# Patient Record
Sex: Female | Born: 1970 | Race: Black or African American | Hispanic: No | Smoking: Never smoker
Health system: Southern US, Community
[De-identification: ages and names within clinical notes are randomized; demographics above are authoritative.]

## PROBLEM LIST (undated history)

## (undated) DIAGNOSIS — I10 Essential (primary) hypertension: Secondary | ICD-10-CM

## (undated) DIAGNOSIS — F329 Major depressive disorder, single episode, unspecified: Secondary | ICD-10-CM

## (undated) DIAGNOSIS — K219 Gastro-esophageal reflux disease without esophagitis: Secondary | ICD-10-CM

## (undated) DIAGNOSIS — G4733 Obstructive sleep apnea (adult) (pediatric): Secondary | ICD-10-CM

## (undated) DIAGNOSIS — K859 Acute pancreatitis without necrosis or infection, unspecified: Secondary | ICD-10-CM

## (undated) DIAGNOSIS — K760 Fatty (change of) liver, not elsewhere classified: Secondary | ICD-10-CM

## (undated) DIAGNOSIS — F4310 Post-traumatic stress disorder, unspecified: Secondary | ICD-10-CM

## (undated) DIAGNOSIS — T8859XA Other complications of anesthesia, initial encounter: Secondary | ICD-10-CM

## (undated) DIAGNOSIS — K5792 Diverticulitis of intestine, part unspecified, without perforation or abscess without bleeding: Secondary | ICD-10-CM

## (undated) DIAGNOSIS — A6000 Herpesviral infection of urogenital system, unspecified: Secondary | ICD-10-CM

## (undated) HISTORY — PX: UMBILICAL HERNIA REPAIR: SHX196

## (undated) HISTORY — PX: TUBAL LIGATION: SHX77

## (undated) HISTORY — PX: LAPAROSCOPIC GASTRIC BAND REMOVAL WITH LAPAROSCOPIC GASTRIC SLEEVE RESECTION: SHX6498

## (undated) HISTORY — PX: KNEE SURGERY: SHX244

## (undated) HISTORY — PX: CHOLECYSTECTOMY: SHX55

## (undated) HISTORY — PX: PANNICULECTOMY: SUR1001

## (undated) HISTORY — PX: ABLATION: SHX5711

## (undated) HISTORY — PX: REFRACTIVE SURGERY: SHX103

---

## 1998-12-16 ENCOUNTER — Emergency Department (HOSPITAL_COMMUNITY): Admission: EM | Admit: 1998-12-16 | Discharge: 1998-12-16 | Payer: Self-pay | Admitting: Emergency Medicine

## 1998-12-17 ENCOUNTER — Encounter: Payer: Self-pay | Admitting: Emergency Medicine

## 1999-11-18 ENCOUNTER — Other Ambulatory Visit: Admission: RE | Admit: 1999-11-18 | Discharge: 1999-11-18 | Payer: Self-pay | Admitting: Obstetrics and Gynecology

## 2000-01-06 ENCOUNTER — Encounter: Payer: Self-pay | Admitting: Obstetrics and Gynecology

## 2000-01-06 ENCOUNTER — Ambulatory Visit (HOSPITAL_COMMUNITY): Admission: RE | Admit: 2000-01-06 | Discharge: 2000-01-06 | Payer: Self-pay | Admitting: Obstetrics and Gynecology

## 2000-02-26 ENCOUNTER — Encounter: Payer: Self-pay | Admitting: Obstetrics & Gynecology

## 2000-02-26 ENCOUNTER — Inpatient Hospital Stay (HOSPITAL_COMMUNITY): Admission: AD | Admit: 2000-02-26 | Discharge: 2000-02-26 | Payer: Self-pay | Admitting: *Deleted

## 2000-03-14 ENCOUNTER — Observation Stay (HOSPITAL_COMMUNITY): Admission: AD | Admit: 2000-03-14 | Discharge: 2000-03-15 | Payer: Self-pay | Admitting: Obstetrics and Gynecology

## 2000-03-14 ENCOUNTER — Encounter: Payer: Self-pay | Admitting: Obstetrics and Gynecology

## 2000-03-30 ENCOUNTER — Inpatient Hospital Stay (HOSPITAL_COMMUNITY): Admission: AD | Admit: 2000-03-30 | Discharge: 2000-03-30 | Payer: Self-pay | Admitting: Obstetrics & Gynecology

## 2000-04-05 ENCOUNTER — Inpatient Hospital Stay (HOSPITAL_COMMUNITY): Admission: AD | Admit: 2000-04-05 | Discharge: 2000-04-05 | Payer: Self-pay | Admitting: *Deleted

## 2000-04-11 ENCOUNTER — Inpatient Hospital Stay (HOSPITAL_COMMUNITY): Admission: AD | Admit: 2000-04-11 | Discharge: 2000-04-11 | Payer: Self-pay | Admitting: Obstetrics & Gynecology

## 2000-05-10 ENCOUNTER — Ambulatory Visit (HOSPITAL_COMMUNITY): Admission: RE | Admit: 2000-05-10 | Discharge: 2000-05-10 | Payer: Self-pay | Admitting: Obstetrics & Gynecology

## 2000-05-10 ENCOUNTER — Encounter: Payer: Self-pay | Admitting: Obstetrics and Gynecology

## 2000-06-05 ENCOUNTER — Inpatient Hospital Stay (HOSPITAL_COMMUNITY): Admission: AD | Admit: 2000-06-05 | Discharge: 2000-06-05 | Payer: Self-pay | Admitting: Obstetrics and Gynecology

## 2000-06-18 ENCOUNTER — Inpatient Hospital Stay (HOSPITAL_COMMUNITY): Admission: AD | Admit: 2000-06-18 | Discharge: 2000-06-22 | Payer: Self-pay | Admitting: Obstetrics and Gynecology

## 2000-11-28 ENCOUNTER — Inpatient Hospital Stay (HOSPITAL_COMMUNITY): Admission: AD | Admit: 2000-11-28 | Discharge: 2000-11-30 | Payer: Self-pay | Admitting: Psychiatry

## 2000-12-01 ENCOUNTER — Other Ambulatory Visit (HOSPITAL_COMMUNITY): Admission: RE | Admit: 2000-12-01 | Discharge: 2000-12-08 | Payer: Self-pay | Admitting: Psychiatry

## 2002-04-26 ENCOUNTER — Emergency Department (HOSPITAL_COMMUNITY): Admission: EM | Admit: 2002-04-26 | Discharge: 2002-04-26 | Payer: Self-pay | Admitting: Emergency Medicine

## 2003-06-24 ENCOUNTER — Emergency Department (HOSPITAL_COMMUNITY): Admission: EM | Admit: 2003-06-24 | Discharge: 2003-06-24 | Payer: Self-pay | Admitting: Emergency Medicine

## 2004-02-25 ENCOUNTER — Emergency Department (HOSPITAL_COMMUNITY): Admission: EM | Admit: 2004-02-25 | Discharge: 2004-02-25 | Payer: Self-pay | Admitting: Emergency Medicine

## 2004-05-31 ENCOUNTER — Emergency Department (HOSPITAL_COMMUNITY): Admission: EM | Admit: 2004-05-31 | Discharge: 2004-05-31 | Payer: Self-pay | Admitting: *Deleted

## 2004-06-08 ENCOUNTER — Ambulatory Visit: Payer: Self-pay | Admitting: Family Medicine

## 2004-06-12 ENCOUNTER — Ambulatory Visit: Payer: Self-pay | Admitting: Family Medicine

## 2004-07-02 ENCOUNTER — Ambulatory Visit: Payer: Self-pay | Admitting: Family Medicine

## 2004-07-06 ENCOUNTER — Ambulatory Visit: Payer: Self-pay | Admitting: *Deleted

## 2004-07-06 ENCOUNTER — Ambulatory Visit (HOSPITAL_COMMUNITY): Admission: RE | Admit: 2004-07-06 | Discharge: 2004-07-06 | Payer: Self-pay | Admitting: Family Medicine

## 2004-07-06 ENCOUNTER — Ambulatory Visit: Payer: Self-pay | Admitting: Family Medicine

## 2004-08-06 ENCOUNTER — Ambulatory Visit: Payer: Self-pay | Admitting: Family Medicine

## 2004-08-19 ENCOUNTER — Encounter (INDEPENDENT_AMBULATORY_CARE_PROVIDER_SITE_OTHER): Payer: Self-pay | Admitting: Family Medicine

## 2004-08-19 LAB — CONVERTED CEMR LAB

## 2004-09-10 ENCOUNTER — Ambulatory Visit: Payer: Self-pay | Admitting: Family Medicine

## 2004-09-17 ENCOUNTER — Ambulatory Visit: Payer: Self-pay | Admitting: Family Medicine

## 2004-09-23 ENCOUNTER — Ambulatory Visit (HOSPITAL_COMMUNITY): Admission: RE | Admit: 2004-09-23 | Discharge: 2004-09-23 | Payer: Self-pay | Admitting: Family Medicine

## 2004-10-15 ENCOUNTER — Ambulatory Visit: Payer: Self-pay | Admitting: Family Medicine

## 2004-10-19 ENCOUNTER — Ambulatory Visit: Payer: Self-pay | Admitting: Family Medicine

## 2005-01-21 ENCOUNTER — Emergency Department (HOSPITAL_COMMUNITY): Admission: EM | Admit: 2005-01-21 | Discharge: 2005-01-22 | Payer: Self-pay | Admitting: Emergency Medicine

## 2005-01-22 ENCOUNTER — Inpatient Hospital Stay (HOSPITAL_COMMUNITY): Admission: AD | Admit: 2005-01-22 | Discharge: 2005-01-28 | Payer: Self-pay | Admitting: Gastroenterology

## 2005-01-22 ENCOUNTER — Ambulatory Visit: Payer: Self-pay | Admitting: Gastroenterology

## 2005-01-26 ENCOUNTER — Encounter (INDEPENDENT_AMBULATORY_CARE_PROVIDER_SITE_OTHER): Payer: Self-pay | Admitting: *Deleted

## 2005-05-10 ENCOUNTER — Ambulatory Visit: Payer: Self-pay | Admitting: Family Medicine

## 2005-07-12 ENCOUNTER — Ambulatory Visit: Payer: Self-pay | Admitting: Family Medicine

## 2005-09-07 ENCOUNTER — Ambulatory Visit: Payer: Self-pay | Admitting: Family Medicine

## 2005-09-08 ENCOUNTER — Ambulatory Visit: Payer: Self-pay | Admitting: Family Medicine

## 2005-10-07 ENCOUNTER — Ambulatory Visit: Payer: Self-pay | Admitting: Family Medicine

## 2005-12-15 ENCOUNTER — Ambulatory Visit: Payer: Self-pay | Admitting: Family Medicine

## 2006-03-08 ENCOUNTER — Encounter: Admission: RE | Admit: 2006-03-08 | Discharge: 2006-03-08 | Payer: Self-pay | Admitting: Obstetrics and Gynecology

## 2006-04-05 ENCOUNTER — Ambulatory Visit (HOSPITAL_COMMUNITY): Admission: RE | Admit: 2006-04-05 | Discharge: 2006-04-05 | Payer: Self-pay | Admitting: Obstetrics and Gynecology

## 2006-04-19 ENCOUNTER — Ambulatory Visit (HOSPITAL_COMMUNITY): Admission: RE | Admit: 2006-04-19 | Discharge: 2006-04-19 | Payer: Self-pay | Admitting: Obstetrics and Gynecology

## 2006-04-21 ENCOUNTER — Inpatient Hospital Stay (HOSPITAL_COMMUNITY): Admission: AD | Admit: 2006-04-21 | Discharge: 2006-04-21 | Payer: Self-pay | Admitting: Obstetrics and Gynecology

## 2006-05-03 ENCOUNTER — Ambulatory Visit (HOSPITAL_COMMUNITY): Admission: RE | Admit: 2006-05-03 | Discharge: 2006-05-03 | Payer: Self-pay | Admitting: Obstetrics and Gynecology

## 2006-05-27 ENCOUNTER — Ambulatory Visit (HOSPITAL_COMMUNITY): Admission: RE | Admit: 2006-05-27 | Discharge: 2006-05-27 | Payer: Self-pay | Admitting: Obstetrics and Gynecology

## 2006-05-31 ENCOUNTER — Encounter: Admission: RE | Admit: 2006-05-31 | Discharge: 2006-06-09 | Payer: Self-pay | Admitting: Obstetrics and Gynecology

## 2006-07-03 ENCOUNTER — Inpatient Hospital Stay (HOSPITAL_COMMUNITY): Admission: AD | Admit: 2006-07-03 | Discharge: 2006-07-03 | Payer: Self-pay | Admitting: Obstetrics and Gynecology

## 2006-08-12 ENCOUNTER — Inpatient Hospital Stay (HOSPITAL_COMMUNITY): Admission: RE | Admit: 2006-08-12 | Discharge: 2006-08-15 | Payer: Self-pay | Admitting: Obstetrics and Gynecology

## 2006-08-12 ENCOUNTER — Encounter (INDEPENDENT_AMBULATORY_CARE_PROVIDER_SITE_OTHER): Payer: Self-pay | Admitting: Specialist

## 2006-12-25 ENCOUNTER — Emergency Department (HOSPITAL_COMMUNITY): Admission: EM | Admit: 2006-12-25 | Discharge: 2006-12-25 | Payer: Self-pay | Admitting: Emergency Medicine

## 2007-01-24 ENCOUNTER — Emergency Department (HOSPITAL_COMMUNITY): Admission: EM | Admit: 2007-01-24 | Discharge: 2007-01-24 | Payer: Self-pay | Admitting: Emergency Medicine

## 2007-03-03 ENCOUNTER — Telehealth (INDEPENDENT_AMBULATORY_CARE_PROVIDER_SITE_OTHER): Payer: Self-pay | Admitting: *Deleted

## 2007-03-07 ENCOUNTER — Encounter (INDEPENDENT_AMBULATORY_CARE_PROVIDER_SITE_OTHER): Payer: Self-pay | Admitting: Family Medicine

## 2007-03-07 DIAGNOSIS — E669 Obesity, unspecified: Secondary | ICD-10-CM | POA: Insufficient documentation

## 2007-03-08 ENCOUNTER — Encounter (INDEPENDENT_AMBULATORY_CARE_PROVIDER_SITE_OTHER): Payer: Self-pay | Admitting: *Deleted

## 2007-03-09 ENCOUNTER — Telehealth (INDEPENDENT_AMBULATORY_CARE_PROVIDER_SITE_OTHER): Payer: Self-pay | Admitting: *Deleted

## 2007-03-10 ENCOUNTER — Ambulatory Visit: Payer: Self-pay | Admitting: Nurse Practitioner

## 2007-03-10 ENCOUNTER — Encounter (INDEPENDENT_AMBULATORY_CARE_PROVIDER_SITE_OTHER): Payer: Self-pay | Admitting: Family Medicine

## 2007-03-10 DIAGNOSIS — K029 Dental caries, unspecified: Secondary | ICD-10-CM | POA: Insufficient documentation

## 2007-03-10 DIAGNOSIS — K0520 Aggressive periodontitis, unspecified: Secondary | ICD-10-CM | POA: Insufficient documentation

## 2009-02-19 ENCOUNTER — Emergency Department (HOSPITAL_COMMUNITY): Admission: EM | Admit: 2009-02-19 | Discharge: 2009-02-19 | Payer: Self-pay | Admitting: Family Medicine

## 2009-08-08 ENCOUNTER — Emergency Department (HOSPITAL_COMMUNITY): Admission: EM | Admit: 2009-08-08 | Discharge: 2009-08-08 | Payer: Self-pay | Admitting: Emergency Medicine

## 2009-09-19 ENCOUNTER — Emergency Department (HOSPITAL_COMMUNITY): Admission: EM | Admit: 2009-09-19 | Discharge: 2009-09-19 | Payer: Self-pay | Admitting: Family Medicine

## 2010-11-06 NOTE — Op Note (Signed)
Carmen Rhodes, Carmen Rhodes               ACCOUNT NO.:  [id number]   MEDICAL RECORD NO.:  [id number]          PATIENT TYPE:  INP   LOCATION:  9111                          FACILITY:  [facility]   PHYSICIAN:  Hal Morales, M.D.DATE OF BIRTH:  May 07, 1971   DATE OF PROCEDURE:  08/12/2006  DATE OF DISCHARGE:                               OPERATIVE REPORT   PREOPERATIVE DIAGNOSES:  1. Intrauterine pregnancy at term.  2. Prior cesarean section x2.  3. Desire for surgical sterilization.  4. History of macrosomia.  5. Morbid obesity with a BMI of 56.   POSTOPERATIVE DIAGNOSES:  1. Intrauterine pregnancy at term.  2. Prior cesarean section x2.  3. Desire for surgical sterilization.  4. History of macrosomia.  5. Morbid obesity with a BMI of 56.  6. Current macrosomia.   PROCEDURES:  Repeat low transverse cesarean section and bilateral tubal  sterilization.   SURGEON:  Hal Morales, M.D.   FIRST ASSISTANT:  Osborn Coho, M.D.   SECOND ASSISTANT:  Elby Showers. Williams, C.N.M.   ANESTHESIA:  Spinal.   ESTIMATED BLOOD LOSS:  150 mL.   COMPLICATIONS:  None.   FINDINGS:  The patient was delivered of a female infant, whose name is  Christiane Ha, weighing 10 pounds 7 ounces; with Apgars of 8 and 9 at 1 and 5  minutes respectively.  The uterus contained a palpable myoma at the  fundus, measuring approximately 5 cm.  The tubes and ovaries were normal  for the gravid state.   PROCEDURE:  The patient was taken to the operating room after  appropriate identification and placed on the operating table.  After the  placement of a spinal anesthetic, she was placed in the supine position  with a left lateral tilt.  The pannus was then taped to the ether  screen.  The abdomen and perineum were prepped with multiple layers of  Betadine and a Foley catheter inserted into the bladder under sterile  conditions, then connected to straight drainage.  The abdomen was draped  as a sterile field.  The  suprapubic region at the site of the previous  cesarean section incision was infiltrated with 30 mL of 0.25% Marcaine.  A suprapubic incision was made in this area and the abdomen opened in  layers.  The peritoneum was entered and an Alexis C-section retractor  was placed in the incision.  The bladder blade was then placed.  The  uterus was incised approximately 2 cm above the uterovesical fold, and  that incision taken laterally with bandage scissors and bluntly.  The  membranes were ruptured with the egress of clear fluid.  The infant was  then delivered from the occiput transverse position with the aid of a  Kiwi vacuum extractor; and, after having the nares and pharynx suctioned  and the cord clamped and cut, was handed off to the awaiting  pediatricians.  The placenta was allowed to separate from the uterus and  was removed from the operative field.  It was taken by the  representative of [facility] for drawing the  appropriate cord bloods  and for cord blood donation.  The cervix was  dilated with a sponge stick and the uterine incision closed with a  running interlocking suture of 0 Vicryl.  An imbricating suture of 0  Vicryl was then placed with adequate hemostasis.   The left fallopian tube was identified and followed to its fimbriated  end, then grasped at the isthmic portion.  A suture of 2-0 chromic was  used to placed through the mesosalpinx and tied fore and aft on the  knuckle of tube.  A second suture ligature was placed proximal to that,  and the intervening knuckle of tube was excised and the cut ends  cauterized.  A similar procedure was carried out on the right side, and  the portions of right and left fallopian tube removed from the operative  field.   The peritoneal cavity was copiously irrigated and noted to be  hemostatic.  The abdominal peritoneum and posterior rectus fascia were  closed with a running suture of 2-0 Vicryl.  The rectus muscles  were  irrigated and made hemostatic with Bovie cautery.  The rectus fascia was  closed with a running suture of 0 Vicryl, then reinforced on either side  of midline with figure-of-eight sutures of 0 Vicryl.  The subcutaneous  tissue was made hemostatic with Bovie cautery, and a subcutaneous  Jackson-Pratt drain placed through a stab wound in the left lower  quadrant.  It was sewn in with a suture of 0 silk.  The skin incision  was closed with a subcuticular suture of  3-0 Monocryl.  Steri-Strips  were applied.  A sterile dressing was applied and the patient taken from  the operating room to the recovery room in satisfactory condition,  having tolerated the procedure well with sponge and instrument counts  correct.   SPECIMENS TO PATHOLOGY:  Portions of right and left fallopian tube.  The  infant went to the full-term nursery.      Hal Morales, M.D.  Electronically Signed     VPH/MEDQ  D:  08/12/2006  T:  08/12/2006  Job:  [id number]

## 2010-11-06 NOTE — Discharge Summary (Signed)
NAMEVERENA, Rhodes NO.:  [id number]   MEDICAL RECORD NO.:  [id number]          PATIENT TYPE:  INP   LOCATION:  5742                         FACILITY:  [facility]   PHYSICIAN:  Adolph Pollack, M.D.DATE OF BIRTH:  [birth year]   DATE OF ADMISSION:  01/22/2005  DATE OF DISCHARGE:  01/28/2005                                 DISCHARGE SUMMARY   DISCHARGE DIAGNOSIS:  1.  Biliary pancreatitis status post laparoscopic cholecystectomy on January 26, 2005.  2.  History of depression.  3.  History of cesarean section.   HOSPITAL COURSE:  Ms. [name] is a 40 year old female who is admitted from  the emergency room on January 22, 2005, after a 2-3 day history of abdominal  pain, nausea and vomiting.  Abdominal ultrasound revealed multiple  gallstones, some wall thickening, common bile duct 4.6 mm.  Initially, she  was discharged from the emergency room and arrangements were made for her to  see a surgeon, however, she again had increasing pain, nausea, diaphoresis,  and came back to the emergency room  where LFTs were mildly elevated.  Her  lipase was 2976.  She was admitted by the gastroenterology service, Dr.  Rob Bunting.  Dr. Carolynne Edouard was consulted, her LFTs, amylase, and lipase began  to normalize.  Eventually, on January 26, 2005, she underwent a laparoscopic  cholecystectomy.  There was a question of distal common bile duct  sludge/stones, and it was decided that the patient would be monitored one  more day.  By January 28, 2005, her lab work remained normal.  Her LFTs did  not elevate.  She was discharged to home.  She is discharged home in stable  condition.   She was given discharge instructions regarding laparoscopic cholecystectomy.  She was given Tylox as needed for pain.  She is to return to the office in 2-  3 weeks, sooner if needed.      Guy Franco, P.A.      Adolph Pollack, M.D.  Electronically Signed    LB/MEDQ  D:  04/08/2005  T:   04/08/2005  Job:  [id number]   cc:   Rachael Fee, M.D.
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
## 2010-11-06 NOTE — Discharge Summary (Signed)
[facility]  Patient:    Carmen Rhodes, Carmen Rhodes                         MRN: [id number] Adm. Date:  [id number] Disc. Date: [id number] Attending:  Shaune Spittle Dictator:   Nigel Bridgeman, C.N.M.                           Discharge Summary  ADMISSION DIAGNOSES:          1. Intrauterine pregnancy at 27-6/7 weeks.                               2. Status post maternal fall.                               3. Previous cesarean section.  DISCHARGE DIAGNOSES:          1. Intrauterine pregnancy at 27-6/7 weeks.                               2. Status post maternal fall.                               3. Previous cesarean section.  PROCEDURES:                   1. Continue electronic fetal monitoring.                               2. Obstetrical ultrasound.  HISTORY OF PRESENT ILLNESS:   [name] is a 40 year old, gravida 2, para 1-0-0-1, at 27-6/7 weeks, who fell and hit her abdomen on March 14, 2000, against a porcelain sink.  On presentation, she denied bleeding, leaking, or cramping.  The left side of her abdomen was tender to palpation. She was seen in the office with cervix closed and long and no bleeding.  She was then sent to the [facility] for monitoring.  The fetal heart rate was reactive.  The CBC was within normal limits.  Keihauer-Betke was negative. UA was negative.  The decision was made to admit her for 23-hour observation secondary to maternal trauma.  An ultrasound was performed, which showed no evidence of abruption, normal fluid, and 28-week growth.  The patient had zero to three contractions per hour.  HOSPITAL COURSE:              She was admitted for 23-hour observation and given Motrin every eight hours.  By the morning of March 15, 2000, the fetal heart rate was reassuring per intermittent monitoring secondary to adipose tissue of the abdomen making it difficult for continuous monitoring to be accomplished.  No  decelerations were noted.  There were no contractions noted through the night.  The fetal heart rate remained within normal limits. Janine Limbo, M.D., evaluated the patient and deemed in the afternoon of March 15, 2000, that she had received the full benefit of her hospital stay.  DISPOSITION:                  She was discharged home.  DISCHARGE INSTRUCTIONS:       Per  [facility] usual instructions.  The patient may return to work.  The patient will observe for fetal movement pattern and will call with any issues of leaking, bleeding, abdominal pain, or any other problems.  DISCHARGE MEDICATIONS:        Motrin 600-800 mg p.o. q.6-8h. p.r.n. cramping or abdominal soreness.  FOLLOW-UP:                    Discharge follow-up will occur in one week at [facility] per her previously scheduled visit. DD:  03/15/00 TD:  03/16/00 Job: [id number]
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
## 2010-11-06 NOTE — Consult Note (Signed)
[facility]  Patient:    Carmen Rhodes, Carmen Rhodes                         MRN: [id number] Adm. Date:  [id number] Attending:  Carolanne Grumbling D Dictator:   Candi Leash. Theressa Stamps, N.P.                          Consultation Report  REASON FOR ADMISSION:  The patient was admitted on November 28, 2000, for depression, and suicidal and homicidal ideations.  REVIEW OF SYSTEMS:  The patient describes her health as good.  She denies any fever, chills, or changes in her appetite.  HEENT:  No problem with blurred or double vision.  She wears reading glasses.  Mouth: No unusual cold or cough symptoms. No changes in her hearing.  CARDIAC: No chest pain, no chest pressure, no palpitations, history of angina or coronary artery disease. RESPIRATORY: Non smoker, no cough, no orthopnea, no history of any COPD.  GI: No heartburn, change in habits, or blood in the stools, no constipation or diarrhea.  GU: No dysuria, frequency, or hematuria.  MUSCULOSKELETAL: No stiffness, swelling, or joint pain.  SKIN: No pruritus, wounds, or redness. NEUROLOGIC: No weakness, seizures or memory loss, no headaches.  PSYCHIATRIC: Some recent history of depression, loss of interest with suicidal and homicidal thoughts.  ENDOCRINE: No diabetic or thyroid problems, no bleeding disorders, no enlarged or tender nodes, no environmental allergies.  PHYSICAL EXAMINATION:  VITAL SIGNS:  Temperature 97.2, pulse 87, respirations 24, blood pressure 149/87, height 5 feet 5 inches, weight 315 pounds.  CONSTITUTION:  The patient is an alert 40 year old African-American female sitting on the exam table in no acute distress.  Her speech is normal.  SKIN:  Her skin is of normal texture.  HEENT:  Head is normocephalic.  She can raise her eyebrows.  Eyes: Pupils are equal and reactive to light.  Her EOMs are intact.  Her funduscopic exam is within normal limits.  Ears: External ear canals are patent.  Tympanic membranes have  normal cone of light without injection.  Hearing is appropriate to conversation.  No nasal discharge, no sinus tenderness.  Mouth: Mucosa is moist, no pharyngeal exudate, good dentition.  Teeth are in good repair.  Neck is supple with full range of motion, negative lymphadenopathy.  Thyroid is not palpable, not enlarged.  Trachea is midline.  Carotid pulses are equal and adequate bilaterally, no bruits were auscultated.  CHEST:  Clear to auscultation without adventitious sounds.  No cough noted, no respiratory distress.  BREAST:  Deferred.  CARDIOVASCULAR:  Regular rate and rhythm without murmurs, gallops, or rubs. No edema was noted.  ABDOMEN: Obese, soft, nontender abdomen, no masses or organomegaly present. The patient has had prior cesarean section.  Active bowel sounds are present. No pulsations or bruits.  No CVA tenderness, no guarding.  EXTREMITIES:  No clubbing, edema, or varicosities were noted.  MUSCULOSKELETAL:  No joint swelling, deformity, or swelling.  Good range of motion.  Good grip strength bilaterally.  NEUROLOGIC:  Oriented x 3.  Cranial nerves are grossly intact.  Gait is normal.  Deep tendon reflexes are 2+.  Cerebellar function is intact with finger-to-finger, heel-to-shin, and normal alternating movements with normal gait.  Romberg checks as negative.  Health maintenance issues were addressed. DD:  12/02/00 TD:  12/02/00 Job: [id number]

## 2010-11-06 NOTE — Discharge Summary (Signed)
[facility]  Patient:    Carmen Rhodes, Carmen Rhodes                         MRN: [id number] Adm. Date:  [id number] Disc. Date: 06/22/00 Attending:  Leonard Schwartz Dictator:   Nigel Bridgeman, C.N.M.                           Discharge Summary  ADMITTING DIAGNOSES:          1. Intrauterine pregnancy at 41 1/2 weeks.                               2. Previous cesarean section.                               3. Obesity.                               4. History of macrosomia.  DISCHARGE DIAGNOSES:          1. At 41 1/2 weeks.                               2. Previous cesarean delivery.                               3. Obesity.                               4. Meconium stained amniotic fluid.                               5. Unsuccessful induction and failure to                                  progress.                               6. Macrosomia.  PROCEDURE:                    1. Repeat low transverse cesarean section.                               2. Spinal anesthesia.  HOSPITAL COURSE:              Carmen Rhodes is a 40 year old gravida 2, para 1-0-0-1 who presented on June 18, 2000 at 50 1/[redacted] weeks gestation for induction of labor.  Patients circumstances were having had a prior cesarean delivery, but desired an attempted vaginal birth.  Pitocin was begun overnight and at 10 a.m. on December 30 her cervix was still 1 cm dilated, 50% effaced, and a -2 station.  Membranes were ruptured and thick meconium stained fluid noted.  Patient had a prolonged deceleration that resolved with positioning and oxygen.  Dr. Stefano Gaul reviewed with the patient and her husband the issues of decelerations and  the status of early labor.  Patient and the father of the baby decided to proceed with cesarean delivery.  Patient was taken to the operating room where a 9 pound 1 ounce female infant by the name of [name] was delivered from vertex.  Apgars were 9 and 9.  There was no  meconium noted beneath the cords.  Patient had a normal operative course and was taken to the recovery room in good condition.  Infant was taken to the full-term nursery in good condition.  By postoperative day #1 patient was doing well.  She was afebrile.  Hemoglobin on day #1 was 10.4 down from 12.4 predelivery.  WBC 6.3, platelets 238, O2 saturations 94% on room air.  Patient was breast-feeding. The rest of her hospital course was essentially uncomplicated.  She had elected abstinence for contraception.  She began incentive spirometry secondary to her history of obesity.  By postoperative day #3 patient was doing well.  She was up at lib.  She was voiding without difficulty.  Her incision was clean, dry, and intact, although there was significant panniculus noted.  The decision was made to maintain her in her staples until June 27, 2000 at which time they will be removed in the office.  The rest of her physical examination was within normal limits.  She was deemed to have received full benefit of her hospital stay and was discharged home.  DISCHARGE INSTRUCTIONS:       Per [facility] handout.  DISCHARGE MEDICATIONS:        1. Motrin 600 mg p.o. q.6h. p.r.n. pain.                               2. Tylox one to two p.o. q.3-4h. p.r.n. pain.                               3. Prenatal vitamin one p.o. q.d.  DISCHARGE FOLLOW-UP:          On June 27, 2000 for staple removal and then in six weeks for routine postpartum examination. DD:  06/22/00 TD:  06/22/00 Job: [id number]
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
## 2010-11-06 NOTE — Op Note (Signed)
[facility]  Patient:    Carmen Rhodes, Carmen Rhodes                         MRN: [id number] Proc. Date: 06/19/00 Adm. Date:  [id number] Attending:  Leonard Schwartz                           Operative Report  PREOPERATIVE DIAGNOSES:       1. A 41-1/[redacted] week gestation.                               2. Prior cesarean delivery.                               3. Obesity (weight 334 pounds).                               4. Meconium stained amniotic fluid.                               5. Unsuccessful induction/failure to progress in                                  labor.  POSTOPERATIVE DIAGNOSES:      1. A 41-1/[redacted] week gestation.                               2. Prior cesarean delivery.                               3. Obesity (weight 334 pounds).                               4. Meconium stained amniotic fluid.                               5. Unsuccessful induction/failure to progress in                                  labor.                               6. Macrosomia.  PROCEDURE:                    Repeat low transverse cesarean section.  SURGEON:                      Janine Limbo, M.D.  FIRST ASSISTANT:              Wynelle Bourgeois, C.N.M.  ANESTHESIA:                   Spinal.  INDICATIONS:                  The patient is a  40 year old female, gravida 2, para 1-0-0-1 who presented on June 18, 2000 at 41-1/[redacted] weeks gestation [facility] June 08, 2000) for induction of labor.  The patient has had a prior cesarean delivery, but she desires an attempted vaginal birth after cesarean delivery.  The risks of VBAC and the risk of cesarean delivery were reviewed with the patient prior to admission.  The patient was given Pitocin overnight and, at 10:00 a.m. on June 19, 2000, her cervix was still 1 cm dilated, 50% effaced, and -3 station.  The patients membranes were ruptured and thick meconium fluid was noted.  The patient had a prolonged deceleration  that resolved with positioning and oxygen.  After carefully considering her options, the patient and the father of the baby decided to proceed with cesarean delivery because of the above mentioned diagnoses.  The specific risks of cesarean delivery were reviewed including, but not limited to anesthetic complications, bleeding, infections and possible damage to surrounding organs.  FINDINGS:                     A 9 pound 1 ounce female infant [name]) was delivered from the cephalic position.  Apgars were 9 at one minute and 9 at five minutes.  There was no meconium noted beneath the vocal cords.  The uterus, fallopian tubes and ovaries were normal for the gravid state.  DESCRIPTION OF PROCEDURE:     The patient was taken to the operating room, where a spinal anesthetic was given.  The patients abdomen was suspended from the anesthesia bar.  The abdomen was then prepped with multiple layers of Betadine, as was the perineum.  A Foley catheter was placed in the bladder. The patient was sterilely draped.  A low transverse incision was made in the abdomen through the previous incision and extended sharply through the subcutaneous tissue, the fascia and the anterior peritoneum.  An incision was made in the lower uterine segment and extended transversely.  The fetal head was delivered.  The mouth and nose were suctioned using the DeLee trap.  The remainder of the infant was delivered.  The infant was handed to the awaiting pediatric team.  The cords were visualized and there was no meconium noted beneath the vocal cords.  Routine cord blood studies were obtained.  The placenta was removed.  The uterine cavity was cleaned of amniotic fluid, clotted blood and membranes.  The uterine incision was closed using a running locking suture of 2-0 Vicryl.  The pericolonic gutters were cleaned of amniotic fluid and clotted blood.  Hemostasis was adequate throughout.  The abdominal musculature and the  anterior peritoneum were closed using a figure-of-eight suture of 2-0 Vicryl.  The fascia was then closed using a running suture of 0 Vicryl followed by three interrupted sutures of 0 Vicryl. The subcutaneous layer was irrigated.  The subcutaneous layer was closed using interrupted sutures of 2-0 Vicryl.  The skin was reapproximated using skin staples.  Sponge, needle and instrument counts were correct on two occasions. Estimated blood loss was 700 cc.  The patient tolerated her procedure well. She was taken to the recovery room in stable condition.  The infant was taken to the full term nursery in stable condition. DD:  06/19/00 TD:  06/19/00 Job: [id number]

## 2010-11-06 NOTE — Op Note (Signed)
Carmen Rhodes, Carmen Rhodes NO.:  [id number]   MEDICAL RECORD NO.:  [id number]          PATIENT TYPE:  INP   LOCATION:  5742                         FACILITY:  [facility]   PHYSICIAN:  Adolph Pollack, M.D.DATE OF BIRTH:  01/24/1971   DATE OF PROCEDURE:  01/26/2005  DATE OF DISCHARGE:                                 OPERATIVE REPORT   PREOPERATIVE DIAGNOSIS:  Biliary pancreatitis.   POSTOPERATIVE DIAGNOSIS:  Biliary pancreatitis.   PROCEDURE:  Laparoscopic cholecystectomy with intraoperative cholangiogram.   SURGEON:  Adolph Pollack, M.D.   ASSISTANT:  Gabrielle Dare. Janee Morn, M.D.   ANESTHESIA:  General.   INDICATIONS:  This is a 40 year old female admitted January 22, 2005, with  biliary pancreatitis.  She has slowly and progressively improved and now  presents for laparoscopic cholecystectomy.  We went over the procedure the  risks (including but not limited to bleeding, infection, wound healing  problems, anesthesia, common bile duct injury, intestinal injury, hepatic  injury, bile leak).   TECHNIQUE:  She was seen holding area and brought to the operating room,  placed on the operating table, and a general anesthetic was administered.  The abdominal wall was sterilely prepped and draped.  Dilute Marcaine  solution was infiltrated the supraumbilical region.  A small supraumbilical  incision was made.  Subcutaneous tissue dissected down until the fascia was identified and a  small incision made in the fascia.  The peritoneal cavity was entered under  direct vision.  A pursestring suture of 0 Vicryl was placed around the  fascial edges.  A Hassan trocar was induced to the peritoneal cavity and  pneumoperitoneum was created by insufflation of CO2 gas.  The laparoscope  was then introduced.   She was placed in reverse Trendelenburg position with the right tight side  tilted slightly upward.  A 10 mm trocar was placed through an epigastric  incision and two 5 mm  trocars were placed through right upper quadrant  incisions.  The fundus of the gallbladder was grasped and filmy adhesions  between the omentum and gallbladder were taken down using cautery and blunt  dissection.  The fundus was retracted to the right shoulder.  The  infundibulum was grasped and mobilized bluntly with dissection, staying on  the gallbladder.  I isolated the cystic duct and created a window around it.  A clip was placed just above the cystic duct-gallbladder junction.  I made a  small incision at the cystic duct-gallbladder junction and milked some bile  back.  I then placed a cholangiocatheter into the peritoneal space to the  anterior abdominal wall and placed it in the cystic duct and the  cholangiogram performed.   Under real-time fluoroscopy, dilute contrast was injected into the cystic  duct which, was of moderate length.  The right and left hepatic ducts  filled.  The common bile duct filled but appeared to have some round  lucencies distally.  It did drained contrast, but slowly.  The biliary  system did not appear to be dilated.  I discussed this with Dr. Jani Files  of radiology, and he agreed that there  could be some small distal common  bile duct stones possibly.   Following this, I removed the cholangiocatheter.  I clipped the cystic duct  proximally and divided it sharply.  I then identified a posterior and  anterior branch of the cystic artery, clipped them and divided.  The gallbladder was dissected free from liver bed.  One what appeared to  accessory ductal Luschka was noted and clipped.  The gallbladder was then  placed an Endopouch bag.   The gallbladder fossa was irrigated and no bleeding or bile leak was noted.  The perihepatic area was irrigated and the irrigation fluid evacuated.   The gallbladder was then removed through the supraumbilical incision and the  subumbilical fascial defect was closed under laparoscopic vision by  tightening up  and tying the pursestring suture.  The remaining trocars were  removed and the pneumoperitoneum was released.  The skin incisions were  closed with 4-0 Monocryl subcuticular stitches, followed by Steri-Strips and  sterile dressings.   She tolerated procedure well without any apparent complications and was  taken to recovery in satisfactory condition.       TJR/MEDQ  D:  01/26/2005  T:  01/27/2005  Job:  [id number]   cc:   Rachael Fee, M.D.

## 2010-11-06 NOTE — Consult Note (Signed)
NAMEABI, SHOULTS NO.:  [id number]   MEDICAL RECORD NO.:  [id number]          PATIENT TYPE:  INP   LOCATION:  5742                         FACILITY:  [facility]   PHYSICIAN:  Ollen Gross. Vernell Morgans, M.D. DATE OF BIRTH:  January 18, 1971   DATE OF CONSULTATION:  DATE OF DISCHARGE:                                   CONSULTATION   DATE OF VISIT:  January 24, 2005.   HISTORY OF PRESENT ILLNESS:  Carmen Rhodes is a 40 year old black female,  who presented several days ago with severe right upper quadrant epigastric  pain.  This was associated with nausea and vomiting.  This had been going on  for the last week.  The pain radiates into her back and has been fairly  unrelenting.  She has had some similar pains during the last six months, but  none of them nearly as bad.  Each of these episodes, when they occur, has  been associated with nausea and vomiting as well.  She states she has been  on a diet for the last six months and the pains occur when she goes off the  diet to treat herself.   REVIEW OF SYSTEMS:  She otherwise denies any fevers, chills, chest pain,  shortness of breath, diarrhea, or dysuria.  The rest of her review of  systems is unremarkable.   PAST MEDICAL HISTORY:  Obesity.   PAST SURGICAL HISTORY:  Two C-sections.   MEDICATIONS:  Birth control pills and Vicodin.   ALLERGIES:  No known drug allergies.   SOCIAL HISTORY:  She denies the use of alcohol or tobacco products.   FAMILY HISTORY:  Noncontributory.   PHYSICAL EXAMINATION:  GENERAL:  She is an obese black female in no acute  distress.  SKIN:  Warm and dry with no jaundice.  EYES:  Extraocular muscles are intact.  Pupils equal, round, and reactive to  light.  Sclerae are nonicteric.  LUNGS:  Clear bilaterally with no use of accessory muscles.  HEART:  Regular rate and rhythm with an impulse in the left chest.  ABDOMEN:  Soft.  There was some mild right upper quadrant epigastric  tenderness, but no  guarding or peritoneal signs.  No palpable mass or  hepatosplenomegaly.  EXTREMITIES:  No cyanosis, clubbing, or edema.  Good strength in her arms  and legs.  PSYCHOLOGIC:  She is alert and oriented x3 with no evidence of anxiety or  depression.   LABORATORY DATA:  On review of her ultrasound, she was noted to have  gallstones, but no thickening of the gallbladder wall or ductal dilatation.  On review of her lab work, she now has a normal white count.  Her liver  functions are normal now.  Her amylase and lipase were quite elevated and  are returning to normal but still slightly elevated.   ASSESSMENT AND PLAN:  This is a 40 year old black female with what appears  to be gallstone pancreatitis.  I agree with bowel rest until her pain and  pancreatic enzymes are resolved, and then I suspect she will benefit from  having her gallbladder removed probably  during this admission.  I have  explained this to her and she is in agreement.  We will follow her closely  with you and await the proper time for a cholecystectomy.      Ollen Gross. Vernell Morgans, M.D.  Electronically Signed     PST/MEDQ  D:  01/24/2005  T:  01/24/2005  Job:  [id number]

## 2010-11-06 NOTE — Discharge Summary (Signed)
Carmen Rhodes, Carmen Rhodes               ACCOUNT NO.:  [id number]   MEDICAL RECORD NO.:  [id number]          PATIENT TYPE:  INP   LOCATION:  9145                          FACILITY:  [facility]   PHYSICIAN:  Crist Fat. Rivard, M.D. DATE OF BIRTH:  [birth year]   DATE OF ADMISSION:  08/12/2006  DATE OF DISCHARGE:  08/15/2006                               DISCHARGE SUMMARY   ADMITTING DIAGNOSES:  1. Intrauterine pregnancy at 39-5/7 weeks.  2. Advanced maternal age.  3. Increased body mass index.  4. Previous cesarean section times two.  5. Rubella negative.  6. 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  7. Plans tubal ligation.  8. History of macrosomia.   DISCHARGE DIAGNOSES:  1. Borderline pregnancy-induced hypertension.  2. Intrauterine pregnancy at term.  3. Previous cesarean section with desire for sterilization.  4. History of macrosomia.  5. Macrosomia with this infant.   PROCEDURES:  1. Repeat low transverse cesarean section.  2. Tubal sterilization.  3. Spinal anesthesia.   HOSPITAL COURSE:  Carmen Rhodes is a 40 year old gravida 4, para 2-0-1-2,  at 39-5/7 weeks who presents today for elective repeat cesarean section,  tubal sterilization.  Pregnancy had been remarkable for:  1. Advanced maternal age.  2. Increased BMI.  3. Previous c-section x2.  4. Rubella negative.  5. 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  6. Plans tubal ligation.   The patient was taken to the operating room where a repeat low  transverse cesarean section was performed with tubal sterilization.  Findings were a viable female, weight 10 pounds, 7 ounces, Apgars were 8  and 9.  There were no abnormal findings noted on the fetus.  Tubal  sterilization was also performed at that time.  Infant was taken to the  Full Term Nursery, mother was taken to Recovery in good condition.  By  postop day 1, patient was doing well, she was up ad lib, her hemoglobin  was 10.5, white blood cell count 7.3.  Her incision was clean,  dry and  intact with subcuticular sutures noted.  She did have a JP drain noted.  She did have some transient diminishing of her saturations with rapid  recovery.  The patient was in the ICU for a short time, however then was  transferred to the floor.  Postop day 2 she was doing well, she had an  isolated elevated temp of 100.5 on August 13, 2006, this did resolve.  She was having some episodes of tachycardia and slight dizziness.  I&O  was begun and p.o. fluids were encouraged.  Clean catch urine was sent  and was negative.  By postop day 3 patient was continuing to do well,  she was up ad lib, she was ready for discharge per her request.  She was  able to ambulate without syncope or limitation, infant was doing well.  Patient's blood pressures were 139/88, 155/98 and 142/92, other vital  signs are stable.  Pulse was 90.  Abdomen was pendulous but her incision  was clean, dry and intact with subcuticular sutures intact.  A JP drain  had a minimal amount of  serosanguineous drainage, was removed without  difficulty.  She had received two doses of HCTZ on February 24 with good  urine output.  She did have 1+ edema in her lower extremities.  Labs  today showed a comprehensive metabolic panel with sodium of 161,  potassium of 4, chloride 103, CO2 29, glucose 88, BUN 7, creatinine  0.54, SGOT was slightly elevated at 40, SGPT was 24.  Urine has shown a  specific gravity greater than 1.030 but negative protein.  Patient was  deemed to have received full benefit of her hospital stay.  Dr. Estanislado Pandy  was consulted and the decision was made to discharge the patient home  but to discharge her on HCTZ 50 mg p.o. daily.   DISCHARGE INSTRUCTIONS:  Per [facility] handout.   DISCHARGE MEDICATIONS:  1. Motrin 600 mg p.o. q.6h. p.r.n. pain.  2. Percocet 5/325 1-2 p.o. q.3-4 hours p.r.n. pain.  3. HCTZ 50 mg one p.o. daily.   DISCHARGE FOLLOWUP:  [facility] nurse will see the patient on August 17, 2006, for blood pressure evaluation, with office followup in 1 week  for blood pressure check.  Patient also had pH labs reviewed and she  will call us if any issues.      Carmen Rhodes, C.N.M.      Crist Fat Rivard, M.D.  Electronically Signed    VLL/MEDQ  D:  08/15/2006  T:  08/15/2006  Job:  [id number]

## 2010-11-06 NOTE — H&P (Signed)
NAMEJENINA, Carmen Rhodes NO.:  [id number]   MEDICAL RECORD NO.:  [id number]          PATIENT TYPE:  EMS   LOCATION:  ED                           FACILITY:  [facility]   PHYSICIAN:  Rachael Fee, M.D. DATE OF BIRTH:  20-Dec-1970   DATE OF ADMISSION:  01/22/2005  DATE OF DISCHARGE:                                HISTORY & PHYSICAL   CHIEF COMPLAINT:  Abdominal pain and nausea, vomiting x 3-4 days.   HISTORY:  Carmen Rhodes is a pleasant 40 year old African-American female generally  in good health who presented to the emergency room, on January 21, 2005, with  a and two to three-day history of abdominal pain, nausea, vomiting.  In the  background of that, she says she has been having milder symptoms over the  past few months, generally exacerbated by heavier meals. She had labs done  with a normal CBC and normal liver function studies, and abdominal  ultrasound showing multiple gallstones but no evidence for acute  cholecystitis and common bile duct normal at 4.6-mm.  She was released to  home with a prescription for Vicodin to use p.r.n. and was to see a surgeon  as an outpatient.  She says she tried to eat this morning to take her pain  medication and had increase in her pain, nausea, and diaphoresis and  therefore, came back to the emergency room where repeat labs show a total  bilirubin 1.3, alk phos 81, SGOT of 81, SGPT of 50, and lipase of 2976.  She  is hemodynamically stable at this time, afebrile, white count 9.6,  hemoglobin 12.1, hematocrit of 36.2.  She is admitted with acute gallstone  pancreatitis for medical management and eventual lap chole with IOC versus  ERCP pre-op, depending on her course.   MEDICATIONS:  1.  Birth control pills daily.  2.  Vicodin on a p.r.n. basis.   ALLERGIES:  No known drug allergies.   PAST HISTORY:  1.  Status post C-section, 1989 and 2001.  2.  History of depression.   FAMILY HISTORY:  Mother status post cholecystectomy,  otherwise negative for  GI disease.  Family history is pertinent for diabetes.   SOCIAL HISTORY:  The patient engaged.  She has one child age 11 and one  teenager.  She is a nonsmoker, nondrinker.  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   REVIEW OF SYSTEMS:  CARDIOVASCULAR:  Denies any chest pain or anginal  symptoms.  PULMONARY:  Negative for cough, shortness of breath, or sputum  production.  GENITOURINARY:  Negative.  GENERAL:  The patient has been on a  diet over the past 6 months, weight is down 47 pounds intentionally, has  been using __________  over the past several weeks.   PHYSICAL EXAM:  GENERAL:  A well-developed, obese, African-American female  in no acute distress.  VITAL SIGNS:  Temperature is 97.3, blood pressure 110/60, pulse in the 60s.  HEENT:  Nontraumatic, normocephalic.  EOMI.  PERRLA.  Sclerae anicteric.  CARDIOVASCULAR:  Regular rate and rhythm with S1-S2.  No murmur, rub,  gallop.  PULMONARY:  Clear to A &  P.  ABDOMEN:  Obese, soft.  Bowel sounds are present but hypoactive.  She is  tender across the upper abdomen.  There is no guarding or rebound.  No mass  or splenomegaly.  RECTAL:  Not done at this time.  EXTREMITIES:  No clubbing, cyanosis, or edema. Pulses are intact.   IMPRESSION:  106.  A 40 year old female with acute biliary pancreatitis, question retained      versus a passed stone.  2.  Multiple gallstones.  3.  Obesity with recent weight loss.  4.  Cesarean section times two.   PLAN:  1.  The patient is admitted to the service of Dr. Wendall Papa for IV fluid      hydration, bowel rest, pain control.  2.  We will check labs in a.m.  3.  We will obtain surgical consultation when her pancreatitis cools down.      For details please see the orders.      Carmen Rhodes   AE/MEDQ  D:  01/22/2005  T:  01/22/2005  Job:  [id number]   cc:   Rachael Fee, M.D.
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
## 2011-05-23 ENCOUNTER — Emergency Department (HOSPITAL_COMMUNITY): Admission: EM | Admit: 2011-05-23 | Discharge: 2011-05-23 | Payer: Self-pay

## 2011-05-23 ENCOUNTER — Encounter: Payer: Self-pay | Admitting: *Deleted

## 2013-09-30 ENCOUNTER — Emergency Department (HOSPITAL_BASED_OUTPATIENT_CLINIC_OR_DEPARTMENT_OTHER)
Admission: EM | Admit: 2013-09-30 | Discharge: 2013-09-30 | Disposition: A | Discharge status: Home / Self Care | Payer: BC Managed Care – HMO | Attending: Emergency Medicine | Admitting: Emergency Medicine

## 2013-09-30 ENCOUNTER — Encounter (HOSPITAL_BASED_OUTPATIENT_CLINIC_OR_DEPARTMENT_OTHER): Payer: Self-pay | Admitting: Emergency Medicine

## 2013-09-30 DIAGNOSIS — K0889 Other specified disorders of teeth and supporting structures: Secondary | ICD-10-CM

## 2013-09-30 DIAGNOSIS — Z79899 Other long term (current) drug therapy: Secondary | ICD-10-CM | POA: Insufficient documentation

## 2013-09-30 DIAGNOSIS — K089 Disorder of teeth and supporting structures, unspecified: Secondary | ICD-10-CM | POA: Insufficient documentation

## 2013-09-30 MED ORDER — PENICILLIN V POTASSIUM 500 MG PO TABS: 500.0000 mg | ORAL_TABLET | Freq: Four times a day (QID) | ORAL | Status: AC

## 2013-09-30 MED ORDER — IBUPROFEN 800 MG PO TABS: 800.0000 mg | ORAL_TABLET | Freq: Three times a day (TID) | ORAL | Status: DC

## 2013-09-30 NOTE — Discharge Instructions (Signed)

## 2013-09-30 NOTE — ED Notes (Signed)
Patient here with tooth and gum pain that has worsened over the past several days, wisodm tooth left lower coming in and pushing against other teeth

## 2013-09-30 NOTE — ED Provider Notes (Signed)
CSN: [id number]     Arrival date & time 09/30/13  [id number] History   First MD Initiated Contact with Patient 09/30/13 0945     Chief Complaint  Patient presents with  . Dental Pain     (Consider location/radiation/quality/duration/timing/severity/associated sxs/prior Treatment) Patient is a 43 y.o. female presenting with tooth pain. The history is provided by the patient.  Dental Pain Location:  Lower Lower teeth location:  17/LL 3rd molar Quality:  Dull Severity:  Mild Onset quality:  Gradual Timing:  Constant Progression:  Unchanged Chronicity:  Recurrent Context: not dental caries, not dental fracture and not trauma   Previous work-up:  Filled cavity Relieved by:  Nothing Worsened by:  Nothing tried Associated symptoms: no fever and no oral lesions     History reviewed. No pertinent past medical history. History reviewed. No pertinent past surgical history. No family history on file. History  Substance Use Topics  . Smoking status: Never Smoker   . Smokeless tobacco: Not on file  . Alcohol Use: Not on file   OB History   Grav Para Term Preterm Abortions TAB SAB Ect Mult Living                 Review of Systems  Constitutional: Negative for fever.  HENT: Positive for dental problem. Negative for mouth sores and trouble swallowing.   Respiratory: Negative for cough and shortness of breath.   All other systems reviewed and are negative.     Allergies  Review of patient's allergies indicates no known allergies.  Home Medications   Current Outpatient Rx  Name  Route  Sig  Dispense  Refill  . lisinopril (PRINIVIL,ZESTRIL) 10 MG tablet   Oral   Take 10 mg by mouth daily.         . sertraline (ZOLOFT) 100 MG tablet   Oral   Take 200 mg by mouth daily.         Marland Kitchen. ibuprofen (ADVIL,MOTRIN) 800 MG tablet   Oral   Take 1 tablet (800 mg total) by mouth 3 (three) times daily.   21 tablet   0   . penicillin v potassium (VEETID) 500 MG tablet   Oral   Take 1  tablet (500 mg total) by mouth 4 (four) times daily.   28 tablet   0    BP 159/97  Pulse 83  Temp(Src) 98 F (36.7 C) (Oral)  Resp 16  Ht 5\' 5"  (1.651 m)  Wt 340 lb (154.223 kg)  BMI 56.58 kg/m2  SpO2 97% Physical Exam  Nursing note and vitals reviewed. Constitutional: She is oriented to person, place, and time. She appears well-developed and well-nourished. No distress.  HENT:  Head: Normocephalic and atraumatic.  Mouth/Throat: Oropharynx is clear and moist. Mucous membranes are not pale and not dry. No oropharyngeal exudate or tonsillar abscesses.    Eyes: EOM are normal. Pupils are equal, round, and reactive to light. Right eye exhibits no discharge.  Neck: Normal range of motion. Neck supple.  Cardiovascular: Normal rate and regular rhythm.  Exam reveals no friction rub.   No murmur heard. Pulmonary/Chest: Effort normal and breath sounds normal. No respiratory distress. She has no wheezes. She has no rales.  Abdominal: Soft. She exhibits no distension. There is no tenderness. There is no rebound.  Musculoskeletal: Normal range of motion. She exhibits no edema.  Neurological: She is alert and oriented to person, place, and time. No cranial nerve deficit. She exhibits normal muscle tone.  Skin: No  rash noted. She is not diaphoretic.    ED Course  Procedures (including critical care time) Labs Review Labs Reviewed - No data to display Imaging Review No results found.   EKG Interpretation None      MDM   Final diagnoses:  Pain, dental    38F here with dental pain since last night. No fevers, no difficulty breathing. Hx of this happening before. On exam, no intraoral abscess, no lymphadenopathy. L mandibular 3rd molar with pain - tooth has multiple fillings. No pericoronitis present. Will give motrin, Penicillin. Instructed to f/u with dentist.    Dagmar Hait, MD 09/30/13 1027

## 2015-01-28 DIAGNOSIS — M5136 Other intervertebral disc degeneration, lumbar region: Secondary | ICD-10-CM | POA: Insufficient documentation

## 2015-01-28 DIAGNOSIS — M5416 Radiculopathy, lumbar region: Secondary | ICD-10-CM | POA: Insufficient documentation

## 2015-01-28 DIAGNOSIS — M51369 Other intervertebral disc degeneration, lumbar region without mention of lumbar back pain or lower extremity pain: Secondary | ICD-10-CM | POA: Insufficient documentation

## 2015-01-28 DIAGNOSIS — K635 Polyp of colon: Secondary | ICD-10-CM | POA: Insufficient documentation

## 2015-01-28 DIAGNOSIS — F4481 Dissociative identity disorder: Secondary | ICD-10-CM | POA: Insufficient documentation

## 2015-01-28 DIAGNOSIS — M545 Low back pain, unspecified: Secondary | ICD-10-CM | POA: Insufficient documentation

## 2015-01-28 DIAGNOSIS — R519 Headache, unspecified: Secondary | ICD-10-CM | POA: Insufficient documentation

## 2015-01-28 DIAGNOSIS — G8929 Other chronic pain: Secondary | ICD-10-CM | POA: Insufficient documentation

## 2015-01-28 DIAGNOSIS — K760 Fatty (change of) liver, not elsewhere classified: Secondary | ICD-10-CM | POA: Insufficient documentation

## 2015-04-24 DIAGNOSIS — M479 Spondylosis, unspecified: Secondary | ICD-10-CM | POA: Insufficient documentation

## 2015-04-24 DIAGNOSIS — I10 Essential (primary) hypertension: Secondary | ICD-10-CM | POA: Insufficient documentation

## 2015-04-24 DIAGNOSIS — G4730 Sleep apnea, unspecified: Secondary | ICD-10-CM | POA: Insufficient documentation

## 2015-06-22 HISTORY — PX: LAPAROSCOPIC GASTRIC BAND REMOVAL WITH LAPAROSCOPIC GASTRIC SLEEVE RESECTION: SHX6498

## 2015-06-25 DIAGNOSIS — F4310 Post-traumatic stress disorder, unspecified: Secondary | ICD-10-CM | POA: Insufficient documentation

## 2015-06-25 DIAGNOSIS — F331 Major depressive disorder, recurrent, moderate: Secondary | ICD-10-CM | POA: Insufficient documentation

## 2015-06-27 DIAGNOSIS — Z8719 Personal history of other diseases of the digestive system: Secondary | ICD-10-CM | POA: Insufficient documentation

## 2015-07-10 DIAGNOSIS — K429 Umbilical hernia without obstruction or gangrene: Secondary | ICD-10-CM | POA: Insufficient documentation

## 2015-07-15 DIAGNOSIS — L219 Seborrheic dermatitis, unspecified: Secondary | ICD-10-CM | POA: Insufficient documentation

## 2015-07-15 DIAGNOSIS — L700 Acne vulgaris: Secondary | ICD-10-CM | POA: Insufficient documentation

## 2015-08-05 DIAGNOSIS — K219 Gastro-esophageal reflux disease without esophagitis: Secondary | ICD-10-CM | POA: Insufficient documentation

## 2016-03-18 DIAGNOSIS — Z9884 Bariatric surgery status: Secondary | ICD-10-CM | POA: Insufficient documentation

## 2016-04-24 DIAGNOSIS — E7800 Pure hypercholesterolemia, unspecified: Secondary | ICD-10-CM | POA: Insufficient documentation

## 2016-06-02 DIAGNOSIS — Z9181 History of falling: Secondary | ICD-10-CM | POA: Insufficient documentation

## 2017-08-11 DIAGNOSIS — Z9884 Bariatric surgery status: Secondary | ICD-10-CM | POA: Insufficient documentation

## 2017-08-11 DIAGNOSIS — E559 Vitamin D deficiency, unspecified: Secondary | ICD-10-CM | POA: Insufficient documentation

## 2017-09-05 ENCOUNTER — Encounter (HOSPITAL_BASED_OUTPATIENT_CLINIC_OR_DEPARTMENT_OTHER): Payer: Self-pay

## 2017-09-05 ENCOUNTER — Emergency Department (HOSPITAL_BASED_OUTPATIENT_CLINIC_OR_DEPARTMENT_OTHER): Payer: Medicaid Other

## 2017-09-05 ENCOUNTER — Other Ambulatory Visit: Payer: Self-pay

## 2017-09-05 ENCOUNTER — Emergency Department (HOSPITAL_BASED_OUTPATIENT_CLINIC_OR_DEPARTMENT_OTHER)
Admission: EM | Admit: 2017-09-05 | Discharge: 2017-09-05 | Disposition: A | Discharge status: Home / Self Care | Payer: Medicaid Other | Attending: Emergency Medicine | Admitting: Emergency Medicine

## 2017-09-05 DIAGNOSIS — R1012 Left upper quadrant pain: Secondary | ICD-10-CM | POA: Insufficient documentation

## 2017-09-05 DIAGNOSIS — Y999 Unspecified external cause status: Secondary | ICD-10-CM | POA: Insufficient documentation

## 2017-09-05 DIAGNOSIS — Y939 Activity, unspecified: Secondary | ICD-10-CM | POA: Insufficient documentation

## 2017-09-05 DIAGNOSIS — Y929 Unspecified place or not applicable: Secondary | ICD-10-CM | POA: Diagnosis not present

## 2017-09-05 DIAGNOSIS — Z91040 Latex allergy status: Secondary | ICD-10-CM | POA: Insufficient documentation

## 2017-09-05 DIAGNOSIS — X58XXXA Exposure to other specified factors, initial encounter: Secondary | ICD-10-CM | POA: Insufficient documentation

## 2017-09-05 DIAGNOSIS — S2232XA Fracture of one rib, left side, initial encounter for closed fracture: Secondary | ICD-10-CM

## 2017-09-05 DIAGNOSIS — Z3202 Encounter for pregnancy test, result negative: Secondary | ICD-10-CM | POA: Insufficient documentation

## 2017-09-05 DIAGNOSIS — Z79899 Other long term (current) drug therapy: Secondary | ICD-10-CM | POA: Diagnosis not present

## 2017-09-05 DIAGNOSIS — S299XXA Unspecified injury of thorax, initial encounter: Secondary | ICD-10-CM | POA: Diagnosis present

## 2017-09-05 HISTORY — DX: Acute pancreatitis without necrosis or infection, unspecified: K85.90

## 2017-09-05 HISTORY — DX: Diverticulitis of intestine, part unspecified, without perforation or abscess without bleeding: K57.92

## 2017-09-05 LAB — CBC
HCT: 37.9 % (ref 36.0–46.0)
Hemoglobin: 12.2 g/dL (ref 12.0–15.0)
MCH: 27 pg (ref 26.0–34.0)
MCHC: 32.2 g/dL (ref 30.0–36.0)
MCV: 83.8 fL (ref 78.0–100.0)
Platelets: 235 10*3/uL (ref 150–400)
RBC: 4.52 MIL/uL (ref 3.87–5.11)
RDW: 15.5 % (ref 11.5–15.5)
WBC: 5.8 10*3/uL (ref 4.0–10.5)

## 2017-09-05 LAB — COMPREHENSIVE METABOLIC PANEL
ALT: 18 U/L (ref 14–54)
AST: 25 U/L (ref 15–41)
Albumin: 3.4 g/dL — ABNORMAL LOW (ref 3.5–5.0)
Alkaline Phosphatase: 109 U/L (ref 38–126)
Anion gap: 7 (ref 5–15)
BILIRUBIN TOTAL: 0.8 mg/dL (ref 0.3–1.2)
BUN: 21 mg/dL — AB (ref 6–20)
CO2: 27 mmol/L (ref 22–32)
Calcium: 8.8 mg/dL — ABNORMAL LOW (ref 8.9–10.3)
Chloride: 109 mmol/L (ref 101–111)
Creatinine, Ser: 0.63 mg/dL (ref 0.44–1.00)
Glucose, Bld: 117 mg/dL — ABNORMAL HIGH (ref 65–99)
POTASSIUM: 3.7 mmol/L (ref 3.5–5.1)
Sodium: 143 mmol/L (ref 135–145)
Total Protein: 6.7 g/dL (ref 6.5–8.1)

## 2017-09-05 LAB — URINALYSIS, ROUTINE W REFLEX MICROSCOPIC
Bilirubin Urine: NEGATIVE
GLUCOSE, UA: NEGATIVE mg/dL
Ketones, ur: 15 mg/dL — AB
Leukocytes, UA: NEGATIVE
Nitrite: NEGATIVE
PH: 6 (ref 5.0–8.0)
PROTEIN: NEGATIVE mg/dL
Specific Gravity, Urine: >1.03 — ABNORMAL HIGH (ref 1.005–1.030)

## 2017-09-05 LAB — URINALYSIS, MICROSCOPIC (REFLEX)

## 2017-09-05 LAB — LIPASE, BLOOD: LIPASE: 34 U/L (ref 11–51)

## 2017-09-05 LAB — PREGNANCY, URINE: Preg Test, Ur: NEGATIVE

## 2017-09-05 MED ORDER — MORPHINE SULFATE (PF) 4 MG/ML IV SOLN
4.0000 mg | Freq: Once | INTRAVENOUS | Status: AC
  Administered 2017-09-05: 4 mg via INTRAVENOUS
  Filled 2017-09-05: qty 1

## 2017-09-05 MED ORDER — OXYCODONE-ACETAMINOPHEN 5-325 MG PO TABS: 2.0000 | ORAL_TABLET | ORAL | 0 refills | Status: DC | PRN

## 2017-09-05 MED ORDER — OXYCODONE-ACETAMINOPHEN 5-325 MG PO TABS
1.0000 | ORAL_TABLET | Freq: Once | ORAL | Status: AC
  Administered 2017-09-05: 1 via ORAL
  Filled 2017-09-05: qty 1

## 2017-09-05 MED ORDER — MORPHINE SULFATE (PF) 4 MG/ML IV SOLN: 4.0000 mg | Freq: Once | INTRAVENOUS | Status: DC

## 2017-09-05 MED ORDER — ONDANSETRON HCL 4 MG/2ML IJ SOLN
4.0000 mg | Freq: Once | INTRAMUSCULAR | Status: AC
  Administered 2017-09-05: 4 mg via INTRAVENOUS
  Filled 2017-09-05: qty 2

## 2017-09-05 MED ORDER — SODIUM CHLORIDE 0.9 % IV BOLUS (SEPSIS)
1000.0000 mL | Freq: Once | INTRAVENOUS | Status: AC
  Administered 2017-09-05: 1000 mL via INTRAVENOUS

## 2017-09-05 MED ORDER — IOPAMIDOL (ISOVUE-300) INJECTION 61%
100.0000 mL | Freq: Once | INTRAVENOUS | Status: AC | PRN
  Administered 2017-09-05: 100 mL via INTRAVENOUS
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
## 2017-09-05 NOTE — ED Notes (Signed)
Pt sts pain gone after 3 big burps.  Pt has resumed drinking contrast.

## 2017-09-05 NOTE — ED Triage Notes (Signed)
C/o LUQ, rib area pain x 3 day-was seen by PCP today-was advised to go to ED-pt [facility] ED due to wait-pt NAD-presents to triage in w/c

## 2017-09-05 NOTE — ED Notes (Addendum)
Pt called out for nurse; c/o sudden severe pain across upper abd; pt tearful and restless; pt sts "feels like I need to go to the bathroom"; pt encouraged to stand and move as much as she is able to see if she gets relief this way. Pt will hold on oral CT contrast for now; EDP made aware.

## 2017-09-05 NOTE — ED Provider Notes (Signed)
[facility] EMERGENCY DEPARTMENT Provider Note   CSN: [id number] Arrival date & time: 09/05/17  1309     History   Chief Complaint Chief Complaint  Patient presents with  . Abdominal Pain    HPI Carmen Rhodes is a 47 y.o. female.  HPI  Carmen Rhodes is a 47 year old female with history of gastric sleeve surgery (2017) and diverticulitis who presents to the emergency department for evaluation of LUQ pain. Patient reports her pain started two days ago and has been gradually worsening. Pain is located in the LUQ and also radiates over the left lateral rib cage. Pain is 10/10 in severity and described as "sharp." Pain occurs when patient touches the left side, lies on the left side, coughs, sneezes, takes a deep breath. Eating does not affect her pain. Her pain resolves when she rests still in the bed. She tried putting a heating pad over the left side and taking tylenol and did not have improvement. She denies fever, chills, nausea/vomiting, dysuria, urinary frequency, hematuria, vaginal discharge, chest pain, sob, lightheadedness. She states that she has had diarrhea since her gastric sleeve surgery and this is unchanged from previous. Her last bowel movement was two days ago and normal for her. Other than gastric sleeve surgery she has had a cholecystectomy, three c-sections and a tubal ligation surgery. She denies history of DVT/PE, leg swelling/calf pain, recent surgery or immobilization, history of cancer, exogenous estrogen. Denies recent injury.   Past Medical History:  Diagnosis Date  . Diverticulitis   . Pancreatitis     Patient Active Problem List   Diagnosis Date Noted  . DENTAL CARIES 03/10/2007  . PERIODONTITIS, ACUTE 03/10/2007  . OBESITY, MORBID 03/07/2007    Past Surgical History:  Procedure Laterality Date  . ABLATION    . CESAREAN SECTION    . CHOLECYSTECTOMY    . KNEE SURGERY    . LAPAROSCOPIC GASTRIC BAND REMOVAL WITH LAPAROSCOPIC GASTRIC SLEEVE  RESECTION    . TUBAL LIGATION      OB History    No data available       Home Medications    Prior to Admission medications   Medication Sig Start Date End Date Taking? Authorizing Provider  ibuprofen (ADVIL,MOTRIN) 800 MG tablet Take 1 tablet (800 mg total) by mouth 3 (three) times daily. 09/30/13   Elwin Mocha, MD  lisinopril (PRINIVIL,ZESTRIL) 10 MG tablet Take 10 mg by mouth daily.    [provider]  sertraline (ZOLOFT) 100 MG tablet Take 200 mg by mouth daily.    [provider]    Family History No family history on file.  Social History Social History   Tobacco Use  . Smoking status: Never Smoker  . Smokeless tobacco: Never Used  Substance Use Topics  . Alcohol use: No    Frequency: Never  . Drug use: No     Allergies   Ace inhibitors; Latex; and Nsaids   Review of Systems Review of Systems  Constitutional: Negative for chills and fever.  Eyes: Negative for visual disturbance.  Respiratory: Negative for cough and shortness of breath.   Cardiovascular: Negative for chest pain and leg swelling.  Gastrointestinal: Positive for abdominal pain (LUQ) and diarrhea (chronic, no recent change). Negative for blood in stool, nausea and vomiting.  Genitourinary: Negative for difficulty urinating, dysuria, frequency and vaginal discharge.  Musculoskeletal: Negative for back pain.  Skin: Negative for rash and wound.  Neurological: Negative for weakness and numbness.  Psychiatric/Behavioral: Negative for  agitation.     Physical Exam Updated Vital Signs BP 133/75 (BP Location: Right Arm)   Pulse 87   Temp 98.2 F (36.8 C) (Oral)   Resp 20   Ht 5\' 5"  (1.651 m)   Wt 95.3 kg (210 lb)   SpO2 99%   BMI 34.95 kg/m   Physical Exam  Constitutional: She is oriented to person, place, and time. She appears well-developed and well-nourished. No distress.  HENT:  Head: Normocephalic and atraumatic.  Mouth/Throat: Oropharynx is clear and moist. No  oropharyngeal exudate.  Mucous membranes moist  Eyes: Conjunctivae are normal. Pupils are equal, round, and reactive to light. Right eye exhibits no discharge. Left eye exhibits no discharge.  Neck: Normal range of motion. Neck supple.  Cardiovascular: Normal rate, regular rhythm and intact distal pulses. Exam reveals no friction rub.  No murmur heard. Pulmonary/Chest: Effort normal and breath sounds normal. No stridor. No respiratory distress. She has no wheezes. She has no rales.  Acutely tender to palpation over lower lateral ribs on the left side. No overlying ecchymosis or rash.   Abdominal: Soft. Bowel sounds are normal.  Abdomen soft and non-distended. Acutely tender in the LUQ with guarding. No rebound tenderness. Left sided CVA tenderness.   Musculoskeletal: Normal range of motion.  Lymphadenopathy:    She has no cervical adenopathy.  Neurological: She is alert and oriented to person, place, and time. Coordination normal.  Skin: Skin is warm and dry. Capillary refill takes less than 2 seconds. She is not diaphoretic.  Psychiatric: She has a normal mood and affect. Her behavior is normal.  Nursing note and vitals reviewed.    ED Treatments / Results  Labs (all labs ordered are listed, but only abnormal results are displayed) Labs Reviewed  URINALYSIS, ROUTINE W REFLEX MICROSCOPIC - Abnormal; Notable for the following components:      Result Value   Specific Gravity, Urine >1.030 (*)    Hgb urine dipstick TRACE (*)    Ketones, ur 15 (*)    All other components within normal limits  URINALYSIS, MICROSCOPIC (REFLEX) - Abnormal; Notable for the following components:   Bacteria, UA RARE (*)    Squamous Epithelial / LPF 0-5 (*)    All other components within normal limits  COMPREHENSIVE METABOLIC PANEL - Abnormal; Notable for the following components:   Glucose, Bld 117 (*)    BUN 21 (*)    Calcium 8.8 (*)    Albumin 3.4 (*)    All other components within normal limits    PREGNANCY, URINE  CBC  LIPASE, BLOOD    EKG  EKG Interpretation None       Radiology Dg Chest 2 View  Result Date: 09/05/2017 CLINICAL DATA:  Patient c/o LUQ pains with nausea, and back pains x 3 days, hx of pancreatitis and diverticulitis, no other complaints EXAM: CHEST - 2 VIEW COMPARISON:  Chest x-rays dated 10/11/2016 09/09/2015. FINDINGS: Study is slightly hypoinspiratory with crowding of the perihilar and bibasilar bronchovascular markings. Given the slightly low lung volumes, lungs are clear. Heart size and mediastinal contours are stable. No pleural effusion or pneumothorax seen. Osseous structures about the chest are unremarkable. IMPRESSION: Low lung volumes. No active cardiopulmonary disease. No evidence of pneumonia or pulmonary edema. Electronically Signed   By: Bary Richard M.D.   On: 09/05/2017 16:41   Ct Abdomen Pelvis W Contrast  Result Date: 09/05/2017 CLINICAL DATA:  Left upper quadrant and right rib pain for the last 3 days. EXAM:  CT ABDOMEN AND PELVIS WITH CONTRAST TECHNIQUE: Multidetector CT imaging of the abdomen and pelvis was performed using the standard protocol following bolus administration of intravenous contrast. CONTRAST:  ISOVUE-300 IOPAMIDOL (ISOVUE-300) INJECTION 61% COMPARISON:  07/07/2015 FINDINGS: Lower chest: Unremarkable Hepatobiliary: Cholecystectomy.  Otherwise unremarkable. Pancreas: Unremarkable Spleen: Unremarkable.  No perisplenic fluid. Adrenals/Urinary Tract: 6 mm hypodense lesion in the right kidney upper pole is technically too small to characterize although statistically likely to be a small cyst or similar benign lesion. The kidneys appear otherwise unremarkable. The adrenal glands and urinary bladder appear normal. Stomach/Bowel: Postoperative findings in the stomach. Normal appendix. Vascular/Lymphatic: Unremarkable Reproductive: Unremarkable Other: No supplemental non-categorized findings. Musculoskeletal: Fracture of the left  anterior tenth rib with about 1.5 mm of displacement on image 30/2. This could well be acute or subacute. I do not observe any other rib lesions. Degenerative facet arthropathy contributing to bilateral foraminal impingement at L4-5 and L5-S1. There is potential left foraminal stenosis at L3-4 due to degenerative disc disease and facet arthropathy. Umbilical or supraumbilical hernia contains adipose tissue as well as a small rim calcification probably from chronic fat necrosis. IMPRESSION: 1. Fracture of the left anterior tenth rib, likely subacute or acute. Very minimal displacement of about 1.5 mm. 2. Lower lumbar impingement due to spondylosis and degenerative disc disease. 3. Umbilical/supraumbilical hernia containing adipose tissue. 4. Postoperative findings in the stomach. Electronically Signed   By: Gaylyn Rong M.D.   On: 09/05/2017 16:47    Procedures Procedures (including critical care time)  Medications Ordered in ED Medications  sodium chloride 0.9 % bolus 1,000 mL (0 mLs Intravenous Stopped 09/05/17 1554)  morphine 4 MG/ML injection 4 mg (4 mg Intravenous Given 09/05/17 1437)  ondansetron (ZOFRAN) injection 4 mg (4 mg Intravenous Given 09/05/17 1437)  iopamidol (ISOVUE-300) 61 % injection 100 mL (100 mLs Intravenous Contrast Given 09/05/17 1619)  oxyCODONE-acetaminophen (PERCOCET/ROXICET) 5-325 MG per tablet 1 tablet (1 tablet Oral Given 09/05/17 1807)     Initial Impression / Assessment and Plan / ED Course  I have reviewed the triage vital signs and the nursing notes.  Pertinent labs & imaging results that were available during my care of the patient were reviewed by me and considered in my medical decision making (see chart for details).     Presents with pain over the LUQ radiating to the left lower chest wall. Denies recent injury that she is aware of. Reports pain is worsened with palpation, sneezing, laying on the side or taking a deep breath. Denies n/v, fever, sob.    On exam patient is afebrile and non-toxic appearing. Acutely tender in the LUQ and over left lower ribs. CT abdomen/pelvis reveals acute fracture of left anterior 10th rib. No other acute intraabdominal abnormality. CXR reveals low lung volumes, likely due to pain with inspiration. Otherwise labs largely unremarkable. CBC WNL. CMP reveals mild hyperglycemia (bg 117), patient informed to have this rechecked with her PCP. Lipase negative. UA without evidence of infection. She has trace hemoglobin in the urine, patient informed to have this rechecked by PCP.   Patient's pain managed in the emergency department. Given an incentive spirometer to encourage deep breathing at home. Will also d/c with percocet. Counseled patient that this medicine can make her drowsy and she should not drive or work while taking it. Counseled patient to have recheck with her PCP. Patient agrees and voices understanding to the above plan and has no complaints prior to discharge.   Final Clinical Impressions(s) / ED  Diagnoses   Final diagnoses:  Closed fracture of one rib of left side, initial encounter    ED Discharge Orders        Ordered    oxyCODONE-acetaminophen (PERCOCET/ROXICET) 5-325 MG tablet  Every 4 hours PRN     09/05/17 1733       Kellie ShropshireShrosbree, [place] J, PA-C 09/06/17 1725    Gwyneth SproutPlunkett, Whitney, MD 09/06/17 2006

## 2017-10-19 DIAGNOSIS — G8918 Other acute postprocedural pain: Secondary | ICD-10-CM | POA: Insufficient documentation

## 2017-11-13 ENCOUNTER — Encounter (HOSPITAL_BASED_OUTPATIENT_CLINIC_OR_DEPARTMENT_OTHER): Payer: Self-pay | Admitting: Emergency Medicine

## 2017-11-13 ENCOUNTER — Other Ambulatory Visit: Payer: Self-pay

## 2017-11-13 ENCOUNTER — Emergency Department (HOSPITAL_BASED_OUTPATIENT_CLINIC_OR_DEPARTMENT_OTHER)
Admission: EM | Admit: 2017-11-13 | Discharge: 2017-11-13 | Disposition: A | Discharge status: Home / Self Care | Payer: Medicaid Other | Attending: Emergency Medicine | Admitting: Emergency Medicine

## 2017-11-13 DIAGNOSIS — Y999 Unspecified external cause status: Secondary | ICD-10-CM | POA: Diagnosis not present

## 2017-11-13 DIAGNOSIS — Z91040 Latex allergy status: Secondary | ICD-10-CM | POA: Insufficient documentation

## 2017-11-13 DIAGNOSIS — Z79899 Other long term (current) drug therapy: Secondary | ICD-10-CM | POA: Insufficient documentation

## 2017-11-13 DIAGNOSIS — S50811A Abrasion of right forearm, initial encounter: Secondary | ICD-10-CM | POA: Insufficient documentation

## 2017-11-13 DIAGNOSIS — Y92009 Unspecified place in unspecified non-institutional (private) residence as the place of occurrence of the external cause: Secondary | ICD-10-CM | POA: Diagnosis not present

## 2017-11-13 DIAGNOSIS — S59911A Unspecified injury of right forearm, initial encounter: Secondary | ICD-10-CM | POA: Diagnosis present

## 2017-11-13 DIAGNOSIS — Y939 Activity, unspecified: Secondary | ICD-10-CM | POA: Diagnosis not present

## 2017-11-13 DIAGNOSIS — Z23 Encounter for immunization: Secondary | ICD-10-CM | POA: Insufficient documentation

## 2017-11-13 HISTORY — DX: Herpesviral infection of urogenital system, unspecified: A60.00

## 2017-11-13 MED ORDER — TETANUS-DIPHTH-ACELL PERTUSSIS 5-2.5-18.5 LF-MCG/0.5 IM SUSP
0.5000 mL | Freq: Once | INTRAMUSCULAR | Status: AC
  Administered 2017-11-13: 0.5 mL via INTRAMUSCULAR
  Filled 2017-11-13: qty 0.5

## 2017-11-13 NOTE — ED Notes (Signed)
Scratch marks to left forearm is covered with dry dressing.
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
## 2017-11-13 NOTE — Discharge Instructions (Addendum)
Please return to the ER if any signs of infection like redness, swelling or fever.   Clean daily with warm soapy water.
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
## 2018-12-14 ENCOUNTER — Other Ambulatory Visit: Payer: Self-pay

## 2018-12-14 ENCOUNTER — Encounter (HOSPITAL_COMMUNITY): Payer: Self-pay | Admitting: *Deleted

## 2018-12-14 ENCOUNTER — Emergency Department (HOSPITAL_COMMUNITY)
Admission: EM | Admit: 2018-12-14 | Discharge: 2018-12-14 | Disposition: A | Discharge status: Home / Self Care | Payer: Medicaid Other | Attending: Emergency Medicine | Admitting: Emergency Medicine

## 2018-12-14 DIAGNOSIS — Z91040 Latex allergy status: Secondary | ICD-10-CM | POA: Insufficient documentation

## 2018-12-14 DIAGNOSIS — X503XXA Overexertion from repetitive movements, initial encounter: Secondary | ICD-10-CM | POA: Diagnosis not present

## 2018-12-14 DIAGNOSIS — Y93H9 Activity, other involving exterior property and land maintenance, building and construction: Secondary | ICD-10-CM | POA: Insufficient documentation

## 2018-12-14 DIAGNOSIS — S4991XA Unspecified injury of right shoulder and upper arm, initial encounter: Secondary | ICD-10-CM | POA: Diagnosis present

## 2018-12-14 DIAGNOSIS — Y92017 Garden or yard in single-family (private) house as the place of occurrence of the external cause: Secondary | ICD-10-CM | POA: Insufficient documentation

## 2018-12-14 DIAGNOSIS — S46911A Strain of unspecified muscle, fascia and tendon at shoulder and upper arm level, right arm, initial encounter: Secondary | ICD-10-CM | POA: Insufficient documentation

## 2018-12-14 DIAGNOSIS — Y999 Unspecified external cause status: Secondary | ICD-10-CM | POA: Diagnosis not present

## 2018-12-14 DIAGNOSIS — Z79899 Other long term (current) drug therapy: Secondary | ICD-10-CM | POA: Insufficient documentation

## 2018-12-14 MED ORDER — HYDROCODONE-ACETAMINOPHEN 5-325 MG PO TABS: 1.0000 | ORAL_TABLET | ORAL | 0 refills | Status: DC | PRN

## 2018-12-14 MED ORDER — LIDOCAINE 5 % EX PTCH
1.0000 | MEDICATED_PATCH | CUTANEOUS | Status: DC
  Administered 2018-12-14: 1 via TRANSDERMAL
  Filled 2018-12-14: qty 1

## 2018-12-14 MED ORDER — LIDOCAINE 5 % EX PTCH: 1.0000 | MEDICATED_PATCH | CUTANEOUS | 0 refills | Status: AC

## 2018-12-14 MED ORDER — DEXAMETHASONE SODIUM PHOSPHATE 10 MG/ML IJ SOLN
10.0000 mg | Freq: Once | INTRAMUSCULAR | Status: AC
  Administered 2018-12-14: 10 mg via INTRAMUSCULAR
  Filled 2018-12-14: qty 1

## 2018-12-14 NOTE — ED Provider Notes (Signed)
[facility] EMERGENCY DEPARTMENT Provider Note   CSN: [id number] Arrival date & time: 12/14/18  0441    History   Chief Complaint Chief Complaint  Patient presents with  . Arm Pain    HPI Carmen Rhodes is a 48 y.o. female.     Patient presents to the emergency department for evaluation of right shoulder pain.  Patient reports symptoms began a couple of days ago.  She has been doing heavy renovations at her house.  She has been putting up a shed, fixing a fence.  She has been hammering and sawing and carrying heavy objects.  She also had a slight fall the other day where she slipped in mud and landed on her hands and knees.  She did not, however, have any pain after the fall.  Patient reports that yesterday the pain intensified.  She has a sharp pain behind the right shoulder that worsens with any movement of the shoulder.  She has been trying to massage the area and ice it but it has not helped.     Past Medical History:  Diagnosis Date  . Diverticulitis   . Genital herpes   . Pancreatitis     Patient Active Problem List   Diagnosis Date Noted  . DENTAL CARIES 03/10/2007  . PERIODONTITIS, ACUTE 03/10/2007  . OBESITY, MORBID 03/07/2007    Past Surgical History:  Procedure Laterality Date  . ABLATION    . CESAREAN SECTION    . CHOLECYSTECTOMY    . KNEE SURGERY    . LAPAROSCOPIC GASTRIC BAND REMOVAL WITH LAPAROSCOPIC GASTRIC SLEEVE RESECTION    . TUBAL LIGATION       OB History   No obstetric history on file.      Home Medications    Prior to Admission medications   Medication Sig Start Date End Date Taking? Authorizing Provider  atorvastatin (LIPITOR) 20 MG tablet Take 20 mg by mouth daily.    [provider]  HYDROcodone-acetaminophen (NORCO/VICODIN) 5-325 MG tablet Take 1 tablet by mouth every 4 (four) hours as needed for moderate pain. 12/14/18   Orpah Greek, MD  ibuprofen (ADVIL,MOTRIN) 800 MG tablet Take 1 tablet  (800 mg total) by mouth 3 (three) times daily. 09/30/13   Evelina Bucy, MD  lidocaine (LIDODERM) 5 % Place 1 patch onto the skin daily. Remove & Discard patch within 12 hours or as directed by MD 12/14/18   Orpah Greek, MD  lisinopril (PRINIVIL,ZESTRIL) 10 MG tablet Take 10 mg by mouth daily.    [provider]  oxyCODONE-acetaminophen (PERCOCET/ROXICET) 5-325 MG tablet Take 2 tablets by mouth every 4 (four) hours as needed for severe pain. 09/05/17   Glyn Ade, PA-C  sertraline (ZOLOFT) 100 MG tablet Take 200 mg by mouth daily.    [provider]  traMADol (ULTRAM) 50 MG tablet Take by mouth every 6 (six) hours as needed.    [provider]  valACYclovir (VALTREX) 500 MG tablet Take 500 mg by mouth 2 (two) times daily.    [provider]    Family History No family history on file.  Social History Social History   Tobacco Use  . Smoking status: Never Smoker  . Smokeless tobacco: Never Used  Substance Use Topics  . Alcohol use: No    Frequency: Never  . Drug use: No     Allergies   Ace inhibitors, Latex, and Nsaids   Review of Systems Review of Systems  Musculoskeletal:  Positive for arthralgias.  All other systems reviewed and are negative.    Physical Exam Updated Vital Signs BP 110/75   Pulse 68   Temp 97.9 F (36.6 C) (Oral)   Resp 18   Ht 5\' 5"  (1.651 m)   Wt 99.8 kg   SpO2 98%   BMI 36.61 kg/m   Physical Exam Vitals signs and nursing note reviewed.  Constitutional:      Appearance: Normal appearance.  HENT:     Head: Normocephalic and atraumatic.  Neck:     Musculoskeletal: Normal range of motion and neck supple.  Cardiovascular:     Rate and Rhythm: Normal rate and regular rhythm.  Pulmonary:     Effort: Pulmonary effort is normal.     Breath sounds: Normal breath sounds.  Musculoskeletal:     Right shoulder: She exhibits decreased range of motion (Painful inhibition) and tenderness (Posterior  soft tissues). She exhibits no swelling and no deformity.       Arms:  Skin:    General: Skin is warm and dry.     Findings: No rash.  Neurological:     General: No focal deficit present.     Mental Status: She is alert.     Cranial Nerves: Cranial nerves are intact.     Sensory: Sensation is intact.     Motor: Motor function is intact.      ED Treatments / Results  Labs (all labs ordered are listed, but only abnormal results are displayed) Labs Reviewed - No data to display  EKG None  Radiology No results found.  Procedures Procedures (including critical care time)  Medications Ordered in ED Medications  lidocaine (LIDODERM) 5 % 1 patch (has no administration in time range)  dexamethasone (DECADRON) injection 10 mg (has no administration in time range)     Initial Impression / Assessment and Plan / ED Course  I have reviewed the triage vital signs and the nursing notes.  Pertinent labs & imaging results that were available during my care of the patient were reviewed by me and considered in my medical decision making (see chart for details).        Patient complains of pain in the right shoulder after performing heavy labor for the last several days.  Area of pain and tenderness is maximal in the posterior soft tissues of the upper back and trapezius muscle region.  She does not have any tenderness in the anterior shoulder over the proximal biceps or subacromial region.  She has painful inhibition with raising the arm, but symptoms do not support diagnosis of rotator cuff injury.  She has an NSAID allergy.  She will have a Lidoderm patch placed over the area of pain, given a Decadron shot and provided analgesia.  Sling for comfort.  Rest the area. Final Clinical Impressions(s) / ED Diagnoses   Final diagnoses:  Shoulder strain, right, initial encounter    ED Discharge Orders         Ordered    lidocaine (LIDODERM) 5 %  Every 24 hours     12/14/18 0520     HYDROcodone-acetaminophen (NORCO/VICODIN) 5-325 MG tablet  Every 4 hours PRN     12/14/18 0520           Gilda CreasePollina, Giara Mcgaughey J, MD 12/14/18 [phone]

## 2018-12-14 NOTE — ED Triage Notes (Signed)
The pt is c/o rt shoulder pain anterior and posterior and down her rt arm for 2 days  No known injury

## 2018-12-14 NOTE — ED Notes (Signed)
Pt does not want to change into gown due to pain in shoulder.

## 2018-12-15 ENCOUNTER — Encounter (HOSPITAL_COMMUNITY): Payer: Self-pay | Admitting: Emergency Medicine

## 2018-12-15 ENCOUNTER — Emergency Department (HOSPITAL_COMMUNITY): Payer: Medicaid Other

## 2018-12-15 ENCOUNTER — Other Ambulatory Visit: Payer: Self-pay

## 2018-12-15 ENCOUNTER — Emergency Department (HOSPITAL_COMMUNITY)
Admission: EM | Admit: 2018-12-15 | Discharge: 2018-12-16 | Disposition: A | Discharge status: Home / Self Care | Payer: Medicaid Other | Attending: Emergency Medicine | Admitting: Emergency Medicine

## 2018-12-15 DIAGNOSIS — M25511 Pain in right shoulder: Secondary | ICD-10-CM | POA: Diagnosis present

## 2018-12-15 DIAGNOSIS — Z79899 Other long term (current) drug therapy: Secondary | ICD-10-CM | POA: Insufficient documentation

## 2018-12-15 MED ORDER — ONDANSETRON 4 MG PO TBDP
4.0000 mg | ORAL_TABLET | Freq: Once | ORAL | Status: AC
  Administered 2018-12-15: 4 mg via ORAL
  Filled 2018-12-15: qty 1

## 2018-12-15 MED ORDER — HYDROMORPHONE HCL 1 MG/ML IJ SOLN
1.0000 mg | Freq: Once | INTRAMUSCULAR | Status: AC
  Administered 2018-12-15: 1 mg via INTRAMUSCULAR
  Filled 2018-12-15: qty 1

## 2018-12-15 NOTE — ED Triage Notes (Signed)
Patient reports doing a lot of home improvement work around the house.  Has been having right shoulder pain that radiates down the arm and into the center of upper back for the last 3 days.  Seen on 25th for the same.  Given steroid shot and placed in a sling and given vicodin with no relief.  Also using lidocaine patches and heating pads with no relief of pain.

## 2018-12-15 NOTE — ED Provider Notes (Signed)
[facility]   CSN: [id number] Arrival date & time: 12/15/18  2119     History   Chief Complaint Chief Complaint  Patient presents with  . Back Pain    HPI Carmen Rhodes is a 48 y.o. female.     Patient re-presents the emergency department with complaint of right shoulder pain.  Pain is all over her shoulder girdle area and is worse with movement.  Patient states that she has been doing home improvement projects and strenuous work with her upper body including hammering prior to onset of pain.  No acute injuries reported.  Pain was severe so she came to the emergency department yesterday where she received pain medication, a steroid shot, and lidocaine patches.  She states that these have not been helping and her pain is continued to be uncontrolled.  She has occasional tingling into her right arm but no weakness.  No color change.  Pain is made better with compression and she is wearing an Ace wrap around her right shoulder.  States that pain is severe.     Past Medical History:  Diagnosis Date  . Diverticulitis   . Genital herpes   . Pancreatitis     Patient Active Problem List   Diagnosis Date Noted  . DENTAL CARIES 03/10/2007  . PERIODONTITIS, ACUTE 03/10/2007  . OBESITY, MORBID 03/07/2007    Past Surgical History:  Procedure Laterality Date  . ABLATION    . CESAREAN SECTION    . CHOLECYSTECTOMY    . KNEE SURGERY    . LAPAROSCOPIC GASTRIC BAND REMOVAL WITH LAPAROSCOPIC GASTRIC SLEEVE RESECTION    . TUBAL LIGATION       OB History   No obstetric history on file.      Home Medications    Prior to Admission medications   Medication Sig Start Date End Date Taking? Authorizing Provider  atorvastatin (LIPITOR) 20 MG tablet Take 20 mg by mouth daily.    [provider]  HYDROcodone-acetaminophen (NORCO/VICODIN) 5-325 MG tablet Take 1 tablet by mouth every 4 (four) hours as needed for moderate pain.  12/14/18   Gilda CreasePollina, Christopher J, MD  ibuprofen (ADVIL,MOTRIN) 800 MG tablet Take 1 tablet (800 mg total) by mouth 3 (three) times daily. 09/30/13   Elwin MochaWalden, Blair, MD  lidocaine (LIDODERM) 5 % Place 1 patch onto the skin daily. Remove & Discard patch within 12 hours or as directed by MD 12/14/18   Gilda CreasePollina, Christopher J, MD  lisinopril (PRINIVIL,ZESTRIL) 10 MG tablet Take 10 mg by mouth daily.    [provider]  oxyCODONE-acetaminophen (PERCOCET/ROXICET) 5-325 MG tablet Take 2 tablets by mouth every 4 (four) hours as needed for severe pain. 09/05/17   Kellie ShropshireShrosbree, Emily J, PA-C  sertraline (ZOLOFT) 100 MG tablet Take 200 mg by mouth daily.    [provider]  traMADol (ULTRAM) 50 MG tablet Take by mouth every 6 (six) hours as needed.    [provider]  valACYclovir (VALTREX) 500 MG tablet Take 500 mg by mouth 2 (two) times daily.    [provider]    Family History No family history on file.  Social History Social History   Tobacco Use  . Smoking status: Never Smoker  . Smokeless tobacco: Never Used  Substance Use Topics  . Alcohol use: No    Frequency: Never  . Drug use: No     Allergies   Ace inhibitors, Latex, and Nsaids   Review of  Systems Review of Systems  Constitutional: Negative for fever.  Musculoskeletal: Positive for arthralgias and myalgias. Negative for back pain, joint swelling and neck pain.  Skin: Negative for wound.  Neurological: Positive for numbness (paresthesia). Negative for weakness.     Physical Exam Updated Vital Signs BP 127/80 (BP Location: Left Arm)   Pulse 85   Temp 98.2 F (36.8 C) (Oral)   Resp 16   Ht 5\' 5"  (1.651 m)   Wt 99.8 kg   SpO2 98%   BMI 36.61 kg/m   Physical Exam Vitals signs and nursing note reviewed.  Constitutional:      Appearance: She is well-developed.  HENT:     Head: Normocephalic and atraumatic.  Eyes:     Pupils: Pupils are equal, round, and reactive to light.  Neck:      Musculoskeletal: Normal range of motion and neck supple.  Cardiovascular:     Pulses: Normal pulses. No decreased pulses.  Musculoskeletal:        General: Tenderness present.     Right shoulder: She exhibits decreased range of motion and tenderness (over entire shoulder girdle).     Cervical back: She exhibits normal range of motion, no tenderness and no bony tenderness.     Right upper arm: Normal.  Skin:    General: Skin is warm and dry.  Neurological:     Mental Status: She is alert.     Sensory: No sensory deficit.     Comments: Motor, sensation, and vascular distal to the injury is fully intact.  2+ radial pulse and right wrist.  Normal capillary refill less than 2 seconds.      ED Treatments / Results  Labs (all labs ordered are listed, but only abnormal results are displayed) Labs Reviewed - No data to display  EKG    Radiology Dg Shoulder Right  Result Date: 12/15/2018 CLINICAL DATA:  Right shoulder pain. EXAM: RIGHT SHOULDER - 2+ VIEW COMPARISON:  None. FINDINGS: There is no evidence of fracture or dislocation. There is no evidence of arthropathy or other focal bone abnormality. Soft tissues are unremarkable. IMPRESSION: No acute displaced fracture or dislocation. Electronically Signed   By: Constance Holster M.D.   On: 12/15/2018 23:19    Procedures Procedures (including critical care time)  Medications Ordered in ED Medications  HYDROmorphone (DILAUDID) injection 1 mg (1 mg Intramuscular Given 12/15/18 2215)  ondansetron (ZOFRAN-ODT) disintegrating tablet 4 mg (4 mg Oral Given 12/15/18 2215)     Initial Impression / Assessment and Plan / ED Course  I have reviewed the triage vital signs and the nursing notes.  Pertinent labs & imaging results that were available during my care of the patient were reviewed by me and considered in my medical decision making (see chart for details).        Patient seen and examined.  Will obtain imaging tonight.  Patient  will be given IM Dilaudid 1 mg with Zofran to better control her pain.  Vital signs reviewed and are as follows: BP 127/80 (BP Location: Left Arm)   Pulse 85   Temp 98.2 F (36.8 C) (Oral)   Resp 16   Ht 5\' 5"  (1.651 m)   Wt 99.8 kg   SpO2 98%   BMI 36.61 kg/m   11:30 PM patient feeling better after treatment here.  We discussed x-ray results.  She has an orthopedist.  Encouraged follow-up within a week for recheck.  Also given referral if needed.  Patient will take  previously prescribed medications at home.  Patient counseled on use of narcotic pain medications. Counseled not to combine these medications with others containing tylenol. Urged not to drink alcohol, drive, or perform any other activities that requires focus while taking these medications. The patient verbalizes understanding and agrees with the plan.   Final Clinical Impressions(s) / ED Diagnoses   Final diagnoses:  Acute pain of right shoulder   Patient with ongoing right shoulder pain, musculoskeletal in nature, after recent overuse.  Patient is not point tender and has general tenderness of the shoulder.  Upper extremity is neurovascularly intact.  X-rays are negative tonight.  Patient was prescribed pain medication and topical analgesic yesterday.  She is allergic to NSAIDs.  Treated with improvement in the ED.  Ready for discharged home.  Strongly encouraged orthopedic follow-up.  ED Discharge Orders    None       Renne CriglerGeiple, Torianna Junio, Cordelia Poche-C 12/15/18 2331    Gerhard MunchLockwood, Robert, MD 12/16/18 2106

## 2018-12-15 NOTE — Discharge Instructions (Signed)
Please read and follow all provided instructions.  Your diagnoses today include:  1. Acute pain of right shoulder     Tests performed today include:  An x-ray of the affected area - does NOT show any broken bones  Vital signs. See below for your results today.   Medications prescribed:   None  Take any prescribed medications only as directed.  Home care instructions:   Follow any educational materials contained in this packet  Follow R.I.C.E. Protocol:  R - rest your injury   I  - use ice on injury without applying directly to skin  C - compress injury with bandage or splint  E - elevate the injury as much as possible  Follow-up instructions: Please follow-up with your orthopedist or the provided orthopedic physician (bone specialist) if you continue to have significant pain in 1 week. In this case you may have a more severe injury that requires further care.   Return instructions:   Please return if your fingers are numb or tingling, appear gray or blue, or you have severe pain (also elevate the arm and loosen splint or wrap if you were given one)  Please return to the Emergency Department if you experience worsening symptoms.   Please return if you have any other emergent concerns.  Additional Information:  Your vital signs today were: BP 127/80 (BP Location: Left Arm)    Pulse 85    Temp 98.2 F (36.8 C) (Oral)    Resp 16    Ht 5\' 5"  (1.651 m)    Wt 99.8 kg    SpO2 98%    BMI 36.61 kg/m  If your blood pressure (BP) was elevated above 135/85 this visit, please have this repeated by your doctor within one month. --------------

## 2018-12-21 ENCOUNTER — Encounter: Payer: Self-pay | Admitting: Physical Therapy

## 2018-12-21 ENCOUNTER — Ambulatory Visit: Payer: Medicaid Other | Attending: Family Medicine | Admitting: Physical Therapy

## 2018-12-21 ENCOUNTER — Other Ambulatory Visit: Payer: Self-pay

## 2018-12-21 DIAGNOSIS — M25511 Pain in right shoulder: Secondary | ICD-10-CM | POA: Diagnosis not present

## 2018-12-21 DIAGNOSIS — M542 Cervicalgia: Secondary | ICD-10-CM | POA: Diagnosis present

## 2018-12-21 DIAGNOSIS — R252 Cramp and spasm: Secondary | ICD-10-CM | POA: Diagnosis present

## 2018-12-21 NOTE — Therapy (Signed)
[facility] [place], [place], [address] Phone: [phone]   Fax:  [phone]  Physical Therapy Evaluation  Patient Details  Name: Carmen [facility] MRN: [id number] Date of Birth: [birth year] Referring Provider (PT): Bronson Curb   Encounter Date: 12/21/2018  PT End of Session - 12/21/18 1042    Visit Number  1    Number of Visits  4    Authorization Type  Medicaid    PT Start Time  1006    PT Stop Time  1100    PT Time Calculation (min)  54 min    Activity Tolerance  Patient limited by pain    Behavior During Therapy  [facility] for tasks assessed/performed       Past Medical History:  Diagnosis Date  . Diverticulitis   . Genital herpes   . Pancreatitis     Past Surgical History:  Procedure Laterality Date  . ABLATION    . CESAREAN SECTION    . CHOLECYSTECTOMY    . KNEE SURGERY    . LAPAROSCOPIC GASTRIC BAND REMOVAL WITH LAPAROSCOPIC GASTRIC SLEEVE RESECTION    . TUBAL LIGATION      There were no vitals filed for this visit.   Subjective Assessment - 12/21/18 1011    Subjective  Patient unsure of a cause for the pain, REports that she was doing some home improvements and that may be how she started the pain.  REports that she has had pain for about 3 months, she does report trying to build a fence on her own.  X-rays negative.  Reports some tingling and numbness in the right fingers, did go to the ED due to pain    Limitations  Lifting;House hold activities    Patient Stated Goals  have less pain, do more, have good ROM    Currently in Pain?  Yes    Pain Score  8     Pain Location  Shoulder    Pain Orientation  Right    Pain Descriptors / Indicators  Aching;Numbness;Tingling;Sharp    Pain Type  Acute pain    Pain Radiating Towards  pain down the right arm, has tingling in the right fingers    Pain Onset  More than a month ago    Pain Frequency  Constant    Aggravating Factors   arm motions, head  motions, lifting, pain up to 10/10    Pain Relieving Factors  positions, rest, heat pain can get down to 2/10    Effect of Pain on Daily Activities  reports limits all ADL's         [facility] PT Assessment - 12/21/18 0001      Assessment   Medical Diagnosis  right shoulder pain and cervical pain    Referring Provider (PT)  D. McKinley    Onset Date/Surgical Date  11/21/18    Hand Dominance  Left      Precautions   Precautions  None      Balance Screen   Has the patient fallen in the past 6 months  No    Has the patient had a decrease in activity level because of a fear of falling?   No    Is the patient reluctant to leave their home because of a fear of falling?   No      Home Environment   Additional Comments  yardwork, housework, handyman work      Prior [employer]  Level of Independence  Independent    Vocation  Unemployed    Vocation Requirements  cumputer work mostly    Leisure  walks for exercise, some yoga      Posture/Postural Control   Posture Comments  fwd head, rounded shoulders      ROM / Strength   AROM / PROM / Strength  AROM;Strength      AROM   Overall AROM Comments  Cervical ROM decreased 50% flexion, 75 % for extension and right rotation and bilateral side bending, left rotation is WNL's, cervical and shoulder ROM increased pain    AROM Assessment Site  Shoulder    Right/Left Shoulder  Right    Right Shoulder Flexion  115 Degrees    Right Shoulder ABduction  110 Degrees    Right Shoulder Internal Rotation  20 Degrees    Right Shoulder External Rotation  45 Degrees      Strength   Overall Strength Comments  3/5 with pain in the right upper trap and shoulder      Palpation   Palpation comment  she did not tolerate even light touch to the right upper trap, the rhomboid area, did not allow much shoulder palpation                Objective measurements completed on examination: See above findings.      [facility] Adult PT Treatment/Exercise -  12/21/18 0001      Modalities   Modalities  Electrical Stimulation;Moist Heat      Moist Heat Therapy   Number Minutes Moist Heat  15 Minutes    Moist Heat Location  Shoulder;Cervical      Electrical Stimulation   Electrical Stimulation Location  right upper trap area into the right shoulder    Electrical Stimulation Action  IFC    Electrical Stimulation Parameters  supine with arm propped    Electrical Stimulation Goals  Pain             PT Education - 12/21/18 1042    Education Details  cervical and scapular retraction, shrugs, upper trap and levator stretch    Person(s) Educated  Patient    Methods  Explanation;Demonstration;Handout;Verbal cues    Comprehension  Verbalized understanding          PT Long Term Goals - 12/21/18 1046      PT LONG TERM GOAL #1   Title  independent with HEP    Time  8    Period  Weeks    Status  New      PT LONG TERM GOAL #2   Title  undertand posture and body mechanics    Time  8    Period  Weeks    Status  New      PT LONG TERM GOAL #3   Title  increase cervical ROM to WNl's    Time  8    Period  Weeks    Status  New      PT LONG TERM GOAL #4   Title  decreaese pain 50%    Time  8    Period  Weeks    Status  New             Plan - 12/21/18 1043    Clinical Impression Statement  Patient reports that she has been doing a lot of projects around the hours including building a fence, she reports that she went to the ED due to severe right shoulder and neck  pain.  X-rays negative.  She has numbness and tingling in the right hand.  She is left handed.  She has severe spasms and is very sensitive to touch in the right upper trap and the rhomboid.  Cervical and shoulder ROM is very limited due to pain.  I could not test specific tests due to her pain levels.  Difficult to solve for shoulder vs neck issues due to the severity of the pain.    Stability/Clinical Decision Making  Evolving/Moderate complexity    Clinical  Decision Making  Moderate    Rehab Potential  Good    PT Frequency  1x / week    PT Duration  8 weeks    PT Treatment/Interventions  ADLs/Self Care Home Management;Cryotherapy;Electrical Stimulation;Iontophoresis 4mg /ml Dexamethasone;Moist Heat;Traction;Ultrasound;Therapeutic activities;Therapeutic exercise;Neuromuscular re-education;Manual techniques;Dry needling;Patient/family education    PT Next Visit Plan  slowly start movements, calm down spasms    Consulted and Agree with Plan of Care  Patient       Patient will benefit from skilled therapeutic intervention in order to improve the following deficits and impairments:  Improper body mechanics, Pain, Increased muscle spasms, Postural dysfunction, Impaired UE functional use, Decreased strength, Decreased range of motion, Difficulty walking, Impaired flexibility  Visit Diagnosis: 1. Acute pain of right shoulder   2. Cervicalgia   3. Cramp and spasm        Problem List Patient Active Problem List   Diagnosis Date Noted  . DENTAL CARIES 03/10/2007  . PERIODONTITIS, ACUTE 03/10/2007  . OBESITY, MORBID 03/07/2007    Jearld LeschALBRIGHT,Kaseem Vastine W., PT 12/21/2018, 10:49 AM  [facility] [address] [place], [place], [address] Phone: [phone]   Fax:  [phone]  Name: Burnard LeighJanice M Vasallo MRN: [id number] Date of Birth: 1971/01/28

## 2018-12-28 ENCOUNTER — Other Ambulatory Visit: Payer: Self-pay

## 2018-12-28 ENCOUNTER — Ambulatory Visit: Payer: Medicaid Other | Admitting: Physical Therapy

## 2018-12-28 DIAGNOSIS — M25511 Pain in right shoulder: Secondary | ICD-10-CM

## 2018-12-28 DIAGNOSIS — R252 Cramp and spasm: Secondary | ICD-10-CM

## 2018-12-28 DIAGNOSIS — M542 Cervicalgia: Secondary | ICD-10-CM

## 2018-12-28 NOTE — Therapy (Signed)
[place], [place], [address] Phone: [phone]   Fax:  [phone]  Physical Therapy Treatment  Patient Details  Name: Carmen Rhodes MRN: [id number] Date of Birth: 07/29/1970 Referring Provider (PT): Bronson Curb   Encounter Date: 12/28/2018  PT End of Session - 12/28/18 1106    Visit Number  2    PT Start Time  4818    PT Stop Time  1115    PT Time Calculation (min)  60 min       Past Medical History:  Diagnosis Date  . Diverticulitis   . Genital herpes   . Pancreatitis     Past Surgical History:  Procedure Laterality Date  . ABLATION    . CESAREAN SECTION    . CHOLECYSTECTOMY    . KNEE SURGERY    . LAPAROSCOPIC GASTRIC BAND REMOVAL WITH LAPAROSCOPIC GASTRIC SLEEVE RESECTION    . TUBAL LIGATION      There were no vitals filed for this visit.  Subjective Assessment - 12/28/18 1017    Subjective  pt arrived very quarded, limited cerv mvmt and shlds elevated. brought in TENS unit    Currently in Pain?  Yes    Pain Score  7     Pain Location  Neck    Pain Orientation  Right                       [facility] Adult PT Treatment/Exercise - 12/28/18 0001      Self-Care   Self-Care  Other Self-Care Comments   reviewed TENS use,application and settings     Exercises   Exercises  Neck      Neck Exercises: Standing   Other Standing Exercises  4# shruggs and rolls 10 each      Neck Exercises: Seated   Other Seated Exercise  PTA resisted shruggs to increase depression- max cuing needed as she is guarded and pain limited      Modalities   Modalities  Moist Heat;Traction      Moist Heat Therapy   Number Minutes Moist Heat  15 Minutes    Moist Heat Location  Shoulder;Cervical;Lumbar Spine      Traction   Type of Traction  Cervical    Min (lbs)  5    Max (lbs)  10    Time  10, increased after 5 min      Manual Therapy   Manual Therapy  Neural  Stretch;Taping;Manual Traction    Manual therapy comments  relief with distraction    Manual Traction  cerv    Neural Stretch  RT UE + with limited tolerance    Kinesiotex  Create Space             PT Education - 12/28/18 1105    Education Details  TENS, struggs an drelaxation Environmental education officer) Educated  Patient    Methods  Explanation;Demonstration;Verbal cues    Comprehension  Verbalized understanding;Returned demonstration          PT Long Term Goals - 12/21/18 1046      PT LONG TERM GOAL #1   Title  independent with HEP    Time  8    Period  Weeks    Status  New      PT LONG TERM GOAL #2   Title  undertand posture and body mechanics    Time  8  Period  Weeks    Status  New      PT LONG TERM GOAL #3   Title  increase cervical ROM to WNl's    Time  8    Period  Weeks    Status  New      PT LONG TERM GOAL #4   Title  decreaese pain 50%    Time  8    Period  Weeks    Status  New            Plan - 12/28/18 1106    Clinical Impression Statement  pt very pain limited and guarded, verb abd tacile cues decreased tolerance to light touch but licked pressure of heat,tape and tratcion. tends to lean head to the left and shrugg shlds. educ in relaxtion, use of TENS and MH.postive NT RT US    PT Treatment/Interventions  ADLs/Self Care Home Management;Cryotherapy;Electrical Stimulation;Iontophoresis 4mg /ml Dexamethasone;Moist Heat;Traction;Ultrasound;Therapeutic activities;Therapeutic exercise;Neuromuscular re-education;Manual techniques;Dry needling;Patient/family education    PT Next Visit Plan  assess pain, STW, ROM and DN       Patient will benefit from skilled therapeutic intervention in order to improve the following deficits and impairments:  Improper body mechanics, Pain, Increased muscle spasms, Postural dysfunction, Impaired UE functional use, Decreased strength, Decreased range of motion, Difficulty walking, Impaired flexibility  Visit  Diagnosis: 1. Cervicalgia   2. Acute pain of right shoulder   3. Cramp and spasm        Problem List Patient Active Problem List   Diagnosis Date Noted  . DENTAL CARIES 03/10/2007  . PERIODONTITIS, ACUTE 03/10/2007  . OBESITY, MORBID 03/07/2007    Heidi Lemay,ANGIE PTA 12/28/2018, 11:13 AM  [facility] [address] [place], [place], [address] Phone: [phone]   Fax:  [phone]  Name: Carmen Rhodes MRN: [id number] Date of Birth: April 25, 1971

## 2019-01-02 ENCOUNTER — Other Ambulatory Visit: Payer: Self-pay

## 2019-01-02 ENCOUNTER — Ambulatory Visit: Payer: Medicaid Other | Admitting: Physical Therapy

## 2019-01-02 DIAGNOSIS — M25511 Pain in right shoulder: Secondary | ICD-10-CM | POA: Diagnosis not present

## 2019-01-02 DIAGNOSIS — M542 Cervicalgia: Secondary | ICD-10-CM

## 2019-01-02 DIAGNOSIS — R252 Cramp and spasm: Secondary | ICD-10-CM

## 2019-01-02 NOTE — Patient Instructions (Signed)

## 2019-01-02 NOTE — Therapy (Signed)
[place], [place], [address] Phone: [phone]   Fax:  [phone]  Physical Therapy Treatment  Patient Details  Name: LUCIANNA OSTLUND MRN: [id number] Date of Birth: 1970/12/23 Referring Provider (PT): Bronson Curb   Encounter Date: 01/02/2019  PT End of Session - 01/02/19 0944    Visit Number  3    Number of Visits  4    Authorization Type  Medicaid    PT Start Time  6767    PT Stop Time  0945    PT Time Calculation (min)  55 min       Past Medical History:  Diagnosis Date  . Diverticulitis   . Genital herpes   . Pancreatitis     Past Surgical History:  Procedure Laterality Date  . ABLATION    . CESAREAN SECTION    . CHOLECYSTECTOMY    . KNEE SURGERY    . LAPAROSCOPIC GASTRIC BAND REMOVAL WITH LAPAROSCOPIC GASTRIC SLEEVE RESECTION    . TUBAL LIGATION      There were no vitals filed for this visit.  Subjective Assessment - 01/02/19 0914    Subjective  some better- using TENS and heat at home. pt more relaxed and not as guarded as noted by shlds not as elevated. Pt arrived with a roll of KT tape she felt that really helped.    Currently in Pain?  Yes    Pain Score  4     Pain Location  Neck    Pain Orientation  Right                       [facility] Adult PT Treatment/Exercise - 01/02/19 0001      Modalities   Modalities  Ultrasound      Moist Heat Therapy   Number Minutes Moist Heat  10 Minutes    Moist Heat Location  Shoulder;Cervical;Lumbar Spine      Ultrasound   Ultrasound Location  RT UT,cerv and rhom    Ultrasound Parameters  100% cont. 1.2 w/cm2    Ultrasound Goals  Pain   tightness     Manual Therapy   Manual Therapy  Soft tissue mobilization    Manual therapy comments  cerv,trap and rhom on RT, still pain limited but tolerated much better.    Kinesiotex  Education administrator on application for [employer] Needling - 01/02/19  0001    Consent Given?  Yes    Education Handout Provided  Yes    Muscles Treated Head and Neck  Upper trapezius;Levator scapulae    Upper Trapezius Response  Palpable increased muscle length;Twitch reponse elicited    Levator Scapulae Response  Twitch response elicited           PT Education - 01/02/19 0943    Education Details  KT tape for home use    Person(s) Educated  Patient    Methods  Explanation;Demonstration    Comprehension  Verbalized understanding          PT Long Term Goals - 12/21/18 1046      PT LONG TERM GOAL #1   Title  independent with HEP    Time  8    Period  Weeks    Status  New      PT LONG TERM GOAL #2   Title  undertand posture and body mechanics  Time  8    Period  Weeks    Status  New      PT LONG TERM GOAL #3   Title  increase cervical ROM to WNl's    Time  8    Period  Weeks    Status  New      PT LONG TERM GOAL #4   Title  decreaese pain 50%    Time  8    Period  Weeks    Status  New            Plan - 01/02/19 0945    Clinical Impression Statement  pt with less guarding and increased mvmt, pain did increase with mvmt and tx. pt tolerated MT better today. STW, combo Tx and DN- all to relax muscles and increase ROM. Multi trigger pts in RT cerv and shld girdle.    PT Treatment/Interventions  ADLs/Self Care Home Management;Cryotherapy;Electrical Stimulation;Iontophoresis 4mg /ml Dexamethasone;Moist Heat;Traction;Ultrasound;Therapeutic activities;Therapeutic exercise;Neuromuscular re-education;Manual techniques;Dry needling;Patient/family education    PT Next Visit Plan  progress as tolerated       Patient will benefit from skilled therapeutic intervention in order to improve the following deficits and impairments:  Improper body mechanics, Pain, Postural dysfunction, Impaired UE functional use, Decreased strength, Decreased range of motion, Difficulty walking, Impaired flexibility  Visit Diagnosis: 1. Cervicalgia   2.  Acute pain of right shoulder   3. Cramp and spasm        Problem List Patient Active Problem List   Diagnosis Date Noted  . DENTAL CARIES 03/10/2007  . PERIODONTITIS, ACUTE 03/10/2007  . OBESITY, MORBID 03/07/2007    Jearld LeschALBRIGHT,Holley Wirt W., PT 01/02/2019, 10:49 AM  [facility] [address] [place], [place], [address] Phone: [phone]   Fax:  [phone]  Name: Burnard LeighJanice M Kita MRN: [id number] Date of Birth: [birth year]

## 2019-01-18 ENCOUNTER — Ambulatory Visit: Payer: Medicaid Other | Admitting: Physical Therapy

## 2019-01-18 ENCOUNTER — Other Ambulatory Visit: Payer: Self-pay

## 2019-01-18 DIAGNOSIS — M25511 Pain in right shoulder: Secondary | ICD-10-CM | POA: Diagnosis not present

## 2019-01-18 DIAGNOSIS — R252 Cramp and spasm: Secondary | ICD-10-CM

## 2019-01-18 DIAGNOSIS — M542 Cervicalgia: Secondary | ICD-10-CM

## 2019-01-18 NOTE — Therapy (Signed)
[place], [place], [address] Phone: [phone]   Fax:  [phone]  Physical Therapy Treatment  Patient Details  Name: Carmen Rhodes MRN: [id number] Date of Birth: [birth year] Referring Provider (PT): Bronson Curb   Encounter Date: 01/18/2019  PT End of Session - 01/18/19 1007    Visit Number  4    Number of Visits  4    Authorization Type  Medicaid    PT Start Time  [phone]    PT Stop Time  1008    PT Time Calculation (min)  30 min       Past Medical History:  Diagnosis Date  . Diverticulitis   . Genital herpes   . Pancreatitis     Past Surgical History:  Procedure Laterality Date  . ABLATION    . CESAREAN SECTION    . CHOLECYSTECTOMY    . KNEE SURGERY    . LAPAROSCOPIC GASTRIC BAND REMOVAL WITH LAPAROSCOPIC GASTRIC SLEEVE RESECTION    . TUBAL LIGATION      There were no vitals filed for this visit.  Subjective Assessment - 01/18/19 0941    Subjective  using vibrate massager at home and feels good. MD started my on steroid and on day 4. DN did help    Currently in Pain?  Yes    Pain Score  3     Pain Location  Neck    Pain Orientation  Right         [facility] PT Assessment - 01/18/19 0001      AROM   Overall AROM Comments  Cervical ROM WFLs    AROM Assessment Site  Shoulder    Right/Left Shoulder  Right    Right Shoulder Flexion  180 Degrees    Right Shoulder ABduction  180 Degrees    Right Shoulder Internal Rotation  60 Degrees    Right Shoulder External Rotation  85 Degrees                   [facility] Adult PT Treatment/Exercise - 01/18/19 0001      Manual Therapy   Manual Therapy  Neural Stretch;Soft tissue mobilization    Soft tissue mobilization  Rt lat epicondyle-( ? if lat epic or from neck) very tender    Neural Stretch  positive , but tolerated better and issued as HEP       Trigger Point Dry Needling - 01/18/19 0001    Consent Given?  Yes    Upper Trapezius Response  Palpable increased muscle length;Twitch reponse elicited    Levator Scapulae Response  Twitch response elicited           PT Education - 01/18/19 0955    Education Details  Neural tension stretches, info to get tennis elbow brace ( possible lateral epicondylitis)    Person(s) Educated  Patient    Methods  Explanation;Demonstration;Handout    Comprehension  Verbalized understanding;Returned demonstration          PT Long Term Goals - 01/18/19 1011      PT LONG TERM GOAL #1   Title  independent with HEP    Status  Partially Met      PT LONG TERM GOAL #2   Title  undertand posture and body mechanics    Status  Partially Met      PT LONG TERM GOAL #3   Title  increase cervical ROM to WNl's    Status  Achieved      PT LONG TERM GOAL #4   Title  decreaese pain 50%    Status  On-going            Plan - 01/18/19 1007    Clinical Impression Statement  pt look much better, decreased guarding and freer mvmt. pt very feeling better since starting teroid 4 days ago. Pt does still present with significant TP in cerv,rhom, trap ,teres and lateral forearm. Positive for NT in RT UE and numbness in 4th and 5th fingers. Discussed now that she is moving better hopefully she can relax and not guard as much and when streoids stop pain will not return.    PT Next Visit Plan  will need to ask for more visits prior to next visit. next visit asses pain and proress into more strengthening and modalities as needed       Patient will benefit from skilled therapeutic intervention in order to improve the following deficits and impairments:  Improper body mechanics, Pain, Postural dysfunction, Impaired UE functional use, Decreased strength, Decreased range of motion, Difficulty walking, Impaired flexibility  Visit Diagnosis: 1. Acute pain of right shoulder   2. Cervicalgia   3. Cramp and spasm        Problem List Patient Active Problem List   Diagnosis Date  Noted  . DENTAL CARIES 03/10/2007  . PERIODONTITIS, ACUTE 03/10/2007  . OBESITY, MORBID 03/07/2007    Carmen Rhodes,Carmen Rhodes  PTA 01/18/2019, 10:12 AM  [place] [address] [place], [place], [address] Phone: [phone]   Fax:  [phone]  Name: Carmen Rhodes MRN: [id number] Date of Birth: [birth year]

## 2019-01-30 ENCOUNTER — Ambulatory Visit: Payer: Medicaid Other | Attending: Family Medicine | Admitting: Physical Therapy

## 2019-01-30 ENCOUNTER — Other Ambulatory Visit: Payer: Self-pay

## 2019-01-30 DIAGNOSIS — M542 Cervicalgia: Secondary | ICD-10-CM | POA: Insufficient documentation

## 2019-01-30 DIAGNOSIS — M25511 Pain in right shoulder: Secondary | ICD-10-CM | POA: Diagnosis not present

## 2019-01-30 DIAGNOSIS — R252 Cramp and spasm: Secondary | ICD-10-CM

## 2019-01-30 NOTE — Therapy (Signed)
[place] [address] [place], [place], [address] Phone: [phone]   Fax:  [phone]  Physical Therapy Treatment  Patient Details  Name: Carmen Rhodes MRN: [id number] Date of Birth: [birth year] Referring Provider (PT): Bronson Curb   Encounter Date: 01/30/2019  PT End of Session - 01/30/19 1035    Visit Number  5    Number of Visits  12    PT Start Time  0932    PT Stop Time  1100    PT Time Calculation (min)  45 min       Past Medical History:  Diagnosis Date  . Diverticulitis   . Genital herpes   . Pancreatitis     Past Surgical History:  Procedure Laterality Date  . ABLATION    . CESAREAN SECTION    . CHOLECYSTECTOMY    . KNEE SURGERY    . LAPAROSCOPIC GASTRIC BAND REMOVAL WITH LAPAROSCOPIC GASTRIC SLEEVE RESECTION    . TUBAL LIGATION      There were no vitals filed for this visit.  Subjective Assessment - 01/30/19 1017    Subjective  90% better. steriods stopped and pain is doing well. pain with certain mvmt and tingling still in fingers. elbow brace helps some.    Currently in Pain?  Yes    Pain Score  2     Pain Location  Neck    Pain Orientation  Right         [facility] PT Assessment - 01/30/19 0001      Strength   Overall Strength Comments  4+/5 BIL UE, RT shld pain with abd and RT elbow pain with ER                   [facility] Adult PT Treatment/Exercise - 01/30/19 0001      Neck Exercises: Machines for Strengthening   UBE (Upper Arm Bike)  L 3 3 fwd/3back   numb/tingling in shld- felt need to move arm around     Neck Exercises: Theraband   Shoulder Extension  15 reps;Red    Rows  15 reps;Red    Shoulder External Rotation  15 reps;Red      Neck Exercises: Standing   Neck Retraction  15 reps;3 secs   head on ball on wall   Wall Push Ups  10 reps   5 CC and 5 CCW     Modalities   Modalities  Iontophoresis      Iontophoresis   Type of Iontophoresis   Dexamethasone    Location  RT ant shld    Dose  1.1 cc dex    Time  4 hour patch       Trigger Point Dry Needling - 01/30/19 0001    Consent Given?  Yes    Muscles Treated Upper Quadrant  Rhomboids    Upper Trapezius Response  Palpable increased muscle length;Twitch reponse elicited    Rhomboids Response  Twitch response elicited;Palpable increased muscle length                PT Long Term Goals - 01/30/19 1027      PT LONG TERM GOAL #1   Title  independent with HEP    Status  Partially Met      PT LONG TERM GOAL #2   Title  undertand posture and body mechanics    Status  Partially Met      PT LONG TERM GOAL #3  Title  increase cervical ROM to WNl's    Status  Achieved      PT LONG TERM GOAL #4   Title  decreaese pain 50%    Status  Achieved            Plan - 01/30/19 1035    Clinical Impression Statement  pt doing much better with pain and guarding, some pain with initial start of ther ex in Rt ant shld ( very tender with palaption) and RT lat elbow. trial of ionto patch. pt still with relief with DN    PT Treatment/Interventions  ADLs/Self Care Home Management;Cryotherapy;Electrical Stimulation;Iontophoresis 46m/ml Dexamethasone;Moist Heat;Traction;Ultrasound;Therapeutic activities;Therapeutic exercise;Neuromuscular re-education;Manual techniques;Dry needling;Patient/family education    PT Next Visit Plan  progress ex, assess pain and need for modalities       Patient will benefit from skilled therapeutic intervention in order to improve the following deficits and impairments:  Improper body mechanics, Pain, Postural dysfunction, Impaired UE functional use, Decreased strength, Decreased range of motion, Difficulty walking, Impaired flexibility  Visit Diagnosis: 1. Acute pain of right shoulder   2. Cervicalgia   3. Cramp and spasm        Problem List Patient Active Problem List   Diagnosis Date Noted  . DENTAL CARIES 03/10/2007  .  PERIODONTITIS, ACUTE 03/10/2007  . OBESITY, MORBID 03/07/2007    ASumner Boast, PT 01/30/2019, 12:18 PM  [address] [address] [address]Phone: [phone]  Fax:  [phone] Name: Carmen FLORENTINOMRN: [id number] of Birth: [birth year]

## 2019-02-01 ENCOUNTER — Ambulatory Visit: Payer: Medicaid Other | Admitting: Physical Therapy

## 2019-02-06 ENCOUNTER — Encounter: Payer: Self-pay | Admitting: Physical Therapy

## 2019-02-06 ENCOUNTER — Ambulatory Visit: Payer: Medicaid Other | Admitting: Physical Therapy

## 2019-02-06 ENCOUNTER — Other Ambulatory Visit: Payer: Self-pay

## 2019-02-06 DIAGNOSIS — M25511 Pain in right shoulder: Secondary | ICD-10-CM

## 2019-02-06 DIAGNOSIS — M542 Cervicalgia: Secondary | ICD-10-CM

## 2019-02-06 DIAGNOSIS — R252 Cramp and spasm: Secondary | ICD-10-CM

## 2019-02-06 NOTE — Therapy (Signed)
[place], [place], [address] Phone: [phone]   Fax:  [phone]  Physical Therapy Treatment  Patient Details  Name: Carmen Rhodes MRN: [id number] Date of Birth: 1970/12/18 Referring Provider (PT): Bronson Curb   Encounter Date: 02/06/2019  PT End of Session - 02/06/19 1058    Visit Number  6    Number of Visits  12    Authorization Type  Medicaid    PT Start Time  1013    PT Stop Time  1051    PT Time Calculation (min)  38 min       Past Medical History:  Diagnosis Date  . Diverticulitis   . Genital herpes   . Pancreatitis     Past Surgical History:  Procedure Laterality Date  . ABLATION    . CESAREAN SECTION    . CHOLECYSTECTOMY    . KNEE SURGERY    . LAPAROSCOPIC GASTRIC BAND REMOVAL WITH LAPAROSCOPIC GASTRIC SLEEVE RESECTION    . TUBAL LIGATION      There were no vitals filed for this visit.  Subjective Assessment - 02/06/19 1014    Subjective  reported no shoulder pain throughout ROM    Currently in Pain?  No/denies                       [facility] Adult PT Treatment/Exercise - 02/06/19 0001      Neck Exercises: Machines for Strengthening   UBE (Upper Arm Bike)  L 3 3 fwd/3back    Other Machines for Strengthening  shoulder extension and row pulley 10# 15 reps; internal and external rotation (5#) pulley 10# 15 reps      Neck Exercises: Standing   Other Standing Exercises  2# empty can and  PNF with verb and tactile cuing   painful arc    Other Standing Exercises  ball stab on wall- cuing needed   wall angels for posture with cuing            PT Education - 02/06/19 1058    Education Details  red scap stab, IR,ER, empty can and PNF    Person(s) Educated  Patient    Methods  Explanation;Demonstration;Verbal cues;Handout    Comprehension  Verbalized understanding;Returned demonstration          PT Long Term Goals - 01/30/19 1027      PT  LONG TERM GOAL #1   Title  independent with HEP    Status  Partially Met      PT LONG TERM GOAL #2   Title  undertand posture and body mechanics    Status  Partially Met      PT LONG TERM GOAL #3   Title  increase cervical ROM to WNl's    Status  Achieved      PT LONG TERM GOAL #4   Title  decreaese pain 50%    Status  Achieved            Plan - 02/06/19 1059    Clinical Impression Statement  pt arrived with no pain , as we ex she had minimal pain with weakness and compensation needing verb and tactile cuing. HEP issued    PT Treatment/Interventions  ADLs/Self Care Home Management;Cryotherapy;Electrical Stimulation;Iontophoresis 53m/ml Dexamethasone;Moist Heat;Traction;Ultrasound;Therapeutic activities;Therapeutic exercise;Neuromuscular re-education;Manual techniques;Dry needling;Patient/family education    PT Next Visit Plan  assess HEP, check ROM and strength, progress       Patient  will benefit from skilled therapeutic intervention in order to improve the following deficits and impairments:  Improper body mechanics, Pain, Postural dysfunction, Impaired UE functional use, Decreased strength, Decreased range of motion, Difficulty walking, Impaired flexibility  Visit Diagnosis: 1. Acute pain of right shoulder   2. Cervicalgia   3. Cramp and spasm        Problem List Patient Active Problem List   Diagnosis Date Noted  . DENTAL CARIES 03/10/2007  . PERIODONTITIS, ACUTE 03/10/2007  . OBESITY, MORBID 03/07/2007    Carmen Rhodes,ANGIE PTA 02/06/2019, 11:01 AM  [place] [address] [place], [place], [address] Phone: [phone]   Fax:  [phone]  Name: Carmen Rhodes MRN: [id number] Date of Birth: [birth year]

## 2019-02-16 ENCOUNTER — Other Ambulatory Visit: Payer: Self-pay

## 2019-02-16 ENCOUNTER — Ambulatory Visit: Payer: Medicaid Other | Admitting: Physical Therapy

## 2019-02-16 DIAGNOSIS — R252 Cramp and spasm: Secondary | ICD-10-CM

## 2019-02-16 DIAGNOSIS — M25511 Pain in right shoulder: Secondary | ICD-10-CM

## 2019-02-16 NOTE — Therapy (Signed)
[place], [place], [address] Phone: [phone]   Fax:  [phone]  Physical Therapy Treatment  Patient Details  Name: Carmen Rhodes MRN: [id number] Date of Birth: 17-Oct-1970 Referring Provider (PT): Bronson Curb   Encounter Date: 02/16/2019  PT End of Session - 02/16/19 1055    Visit Number  7    Number of Visits  12    Authorization Type  Medicaid    PT Start Time  6962    PT Stop Time  1050    PT Time Calculation (min)  35 min       Past Medical History:  Diagnosis Date  . Diverticulitis   . Genital herpes   . Pancreatitis     Past Surgical History:  Procedure Laterality Date  . ABLATION    . CESAREAN SECTION    . CHOLECYSTECTOMY    . KNEE SURGERY    . LAPAROSCOPIC GASTRIC BAND REMOVAL WITH LAPAROSCOPIC GASTRIC SLEEVE RESECTION    . TUBAL LIGATION      There were no vitals filed for this visit.  Subjective Assessment - 02/16/19 1018    Subjective  last 2 days having increased pain in back ( pointing to rhomboids). hanging wallpaper    Currently in Pain?  Yes    Pain Score  6     Pain Location  Shoulder    Pain Orientation  Right;Posterior                       [facility] Adult PT Treatment/Exercise - 02/16/19 0001      Self-Care   Self-Care  Other Self-Care Comments   discussed strain and stres with doing things OH and in front     Ultrasound   Ultrasound Location  RT rhom with estim and PTA pressure for STW    Ultrasound Parameters  100% cont 1.2 w/cm 2    Ultrasound Goals  Pain                  PT Long Term Goals - 01/30/19 1027      PT LONG TERM GOAL #1   Title  independent with HEP    Status  Partially Met      PT LONG TERM GOAL #2   Title  undertand posture and body mechanics    Status  Partially Met      PT LONG TERM GOAL #3   Title  increase cervical ROM to WNl's    Status  Achieved      PT LONG TERM GOAL #4   Title   decreaese pain 50%    Status  Achieved            Plan - 02/16/19 1055    Clinical Impression Statement  arrived in pain  ROM WFLS, MMT WFL except for superspinatus painful and weak. discussed doing thinsg OH and out in front and counter acting with stretches,rest and TENS. PT attempted TENS and pt was unable ot tolerate    PT Next Visit Plan  rec f/u with MD       Patient will benefit from skilled therapeutic intervention in order to improve the following deficits and impairments:  Improper body mechanics, Pain, Postural dysfunction, Impaired UE functional use, Decreased strength, Decreased range of motion, Difficulty walking, Impaired flexibility  Visit Diagnosis: Acute pain of right shoulder  Cramp and spasm     Problem List Patient Active  Problem List   Diagnosis Date Noted  . DENTAL CARIES 03/10/2007  . PERIODONTITIS, ACUTE 03/10/2007  . OBESITY, MORBID 03/07/2007    Don Tiu,ANGIE  PTA 02/16/2019, 10:57 AM  [place] [address] [place], [place], [address] Phone: [phone]   Fax:  [phone]  Name: Carmen Rhodes MRN: [id number] Date of Birth: [birth year]

## 2019-02-19 ENCOUNTER — Encounter (HOSPITAL_COMMUNITY): Payer: Self-pay | Admitting: Emergency Medicine

## 2019-02-19 ENCOUNTER — Emergency Department (HOSPITAL_COMMUNITY): Payer: Medicaid Other

## 2019-02-19 ENCOUNTER — Other Ambulatory Visit: Payer: Self-pay

## 2019-02-19 ENCOUNTER — Emergency Department (HOSPITAL_COMMUNITY)
Admission: EM | Admit: 2019-02-19 | Discharge: 2019-02-19 | Disposition: A | Discharge status: Home / Self Care | Payer: Medicaid Other | Attending: Emergency Medicine | Admitting: Emergency Medicine

## 2019-02-19 DIAGNOSIS — R0781 Pleurodynia: Secondary | ICD-10-CM

## 2019-02-19 DIAGNOSIS — Z79899 Other long term (current) drug therapy: Secondary | ICD-10-CM | POA: Diagnosis not present

## 2019-02-19 DIAGNOSIS — Z91040 Latex allergy status: Secondary | ICD-10-CM | POA: Diagnosis not present

## 2019-02-19 DIAGNOSIS — R1084 Generalized abdominal pain: Secondary | ICD-10-CM | POA: Diagnosis not present

## 2019-02-19 LAB — COMPREHENSIVE METABOLIC PANEL
ALT: 20 U/L (ref 0–44)
AST: 24 U/L (ref 15–41)
Albumin: 3.7 g/dL (ref 3.5–5.0)
Alkaline Phosphatase: 81 U/L (ref 38–126)
Anion gap: 9 (ref 5–15)
BUN: 11 mg/dL (ref 6–20)
CO2: 28 mmol/L (ref 22–32)
Calcium: 9.1 mg/dL (ref 8.9–10.3)
Chloride: 102 mmol/L (ref 98–111)
Creatinine, Ser: 0.54 mg/dL (ref 0.44–1.00)
GFR calc Af Amer: >60 mL/min (ref >60)
GFR calc non Af Amer: >60 mL/min (ref >60)
Glucose, Bld: 87 mg/dL (ref 70–99)
Potassium: 3.7 mmol/L (ref 3.5–5.1)
Sodium: 139 mmol/L (ref 135–145)
Total Bilirubin: 0.9 mg/dL (ref 0.3–1.2)
Total Protein: 6.8 g/dL (ref 6.5–8.1)

## 2019-02-19 LAB — CBC WITH DIFFERENTIAL/PLATELET
Abs Immature Granulocytes: 0.02 10*3/uL (ref 0.00–0.07)
Basophils Absolute: 0 10*3/uL (ref 0.0–0.1)
Basophils Relative: 1 %
Eosinophils Absolute: 0 10*3/uL (ref 0.0–0.5)
Eosinophils Relative: 0 %
HCT: 42.3 % (ref 36.0–46.0)
Hemoglobin: 13.2 g/dL (ref 12.0–15.0)
Immature Granulocytes: 0 %
Lymphocytes Relative: 41 %
Lymphs Abs: 2 10*3/uL (ref 0.7–4.0)
MCH: 27.4 pg (ref 26.0–34.0)
MCHC: 31.2 g/dL (ref 30.0–36.0)
MCV: 87.8 fL (ref 80.0–100.0)
Monocytes Absolute: 0.4 10*3/uL (ref 0.1–1.0)
Monocytes Relative: 9 %
Neutro Abs: 2.4 10*3/uL (ref 1.7–7.7)
Neutrophils Relative %: 49 %
Platelets: 274 10*3/uL (ref 150–400)
RBC: 4.82 MIL/uL (ref 3.87–5.11)
RDW: 14.8 % (ref 11.5–15.5)
WBC: 4.9 10*3/uL (ref 4.0–10.5)
nRBC: 0 % (ref 0.0–0.2)

## 2019-02-19 LAB — LIPASE, BLOOD: Lipase: 31 U/L (ref 11–51)

## 2019-02-19 MED ORDER — OXYCODONE-ACETAMINOPHEN 5-325 MG PO TABS
1.0000 | ORAL_TABLET | Freq: Once | ORAL | Status: AC
  Administered 2019-02-19: 11:00:00 1 via ORAL
  Filled 2019-02-19: qty 1

## 2019-02-19 MED ORDER — IOHEXOL 300 MG/ML  SOLN
100.0000 mL | Freq: Once | INTRAMUSCULAR | Status: AC | PRN
  Administered 2019-02-19: 100 mL via INTRAVENOUS

## 2019-02-19 NOTE — ED Provider Notes (Signed)
[facility] EMERGENCY DEPARTMENT Provider Note   CSN: [id number] Arrival date & time: 02/19/19  8250     History   Chief Complaint No chief complaint on file.   HPI ARIENNE DEUR is a 48 y.o. female.     HPI   48 year old female presents today with complaints of rib pain.  Patient notes that 2 days ago she was doing some home maintenance when her boyfriend fell on top of her.  She notes pain to her bilateral ribs.  She notes some minor shortness of breath and pain with inspiration.  She notes additional abdominal pain.  No distal neurological deficits no head trauma no other injuries.  She notes she is not pregnant.  Past Medical History:  Diagnosis Date  . Diverticulitis   . Genital herpes   . Pancreatitis     Patient Active Problem List   Diagnosis Date Noted  . DENTAL CARIES 03/10/2007  . PERIODONTITIS, ACUTE 03/10/2007  . OBESITY, MORBID 03/07/2007    Past Surgical History:  Procedure Laterality Date  . ABLATION    . CESAREAN SECTION    . CHOLECYSTECTOMY    . KNEE SURGERY    . LAPAROSCOPIC GASTRIC BAND REMOVAL WITH LAPAROSCOPIC GASTRIC SLEEVE RESECTION    . TUBAL LIGATION       OB History   No obstetric history on file.      Home Medications    Prior to Admission medications   Medication Sig Start Date End Date Taking? Authorizing Provider  atorvastatin (LIPITOR) 20 MG tablet Take 20 mg by mouth daily.    [provider]  HYDROcodone-acetaminophen (NORCO/VICODIN) 5-325 MG tablet Take 1 tablet by mouth every 4 (four) hours as needed for moderate pain. 12/14/18   Gilda Crease, MD  ibuprofen (ADVIL,MOTRIN) 800 MG tablet Take 1 tablet (800 mg total) by mouth 3 (three) times daily. 09/30/13   Elwin Mocha, MD  lidocaine (LIDODERM) 5 % Place 1 patch onto the skin daily. Remove & Discard patch within 12 hours or as directed by MD 12/14/18   Gilda Crease, MD  lisinopril (PRINIVIL,ZESTRIL) 10 MG tablet Take 10 mg by  mouth daily.    [provider]  oxyCODONE-acetaminophen (PERCOCET/ROXICET) 5-325 MG tablet Take 2 tablets by mouth every 4 (four) hours as needed for severe pain. 09/05/17   Kellie Shropshire, PA-C  sertraline (ZOLOFT) 100 MG tablet Take 200 mg by mouth daily.    [provider]  traMADol (ULTRAM) 50 MG tablet Take by mouth every 6 (six) hours as needed.    [provider]  valACYclovir (VALTREX) 500 MG tablet Take 500 mg by mouth 2 (two) times daily.    [provider]    Family History No family history on file.  Social History Social History   Tobacco Use  . Smoking status: Never Smoker  . Smokeless tobacco: Never Used  Substance Use Topics  . Alcohol use: No    Frequency: Never  . Drug use: No     Allergies   Ace inhibitors, Latex, and Nsaids   Review of Systems Review of Systems  All other systems reviewed and are negative.   Physical Exam Updated Vital Signs BP 113/75 (BP Location: Right Arm)   Pulse 62   Temp 98.4 F (36.9 C) (Oral)   Resp 18   Ht 5\' 5"  (1.651 m)   Wt 99.8 kg   SpO2 99%   BMI 36.61 kg/m   Physical Exam Vitals  signs and nursing note reviewed.  Constitutional:      Appearance: She is well-developed.  HENT:     Head: Normocephalic and atraumatic.  Eyes:     General: No scleral icterus.       Right eye: No discharge.        Left eye: No discharge.     Conjunctiva/sclera: Conjunctivae normal.     Pupils: Pupils are equal, round, and reactive to light.  Neck:     Musculoskeletal: Normal range of motion.     Vascular: No JVD.     Trachea: No tracheal deviation.  Pulmonary:     Effort: Pulmonary effort is normal.     Breath sounds: No stridor.     Comments: Tenderness to palpation of the bilateral anterior ribs, lungs expansion normal, lung sounds clear throughout Abdominal:     Comments: Abdomen with no bruising, exquisite tenderness throughout  Neurological:     Mental Status: She is alert and  oriented to person, place, and time.     Coordination: Coordination normal.  Psychiatric:        Behavior: Behavior normal.        Thought Content: Thought content normal.        Judgment: Judgment normal.      ED Treatments / Results  Labs (all labs ordered are listed, but only abnormal results are displayed) Labs Reviewed  CBC WITH DIFFERENTIAL/PLATELET  COMPREHENSIVE METABOLIC PANEL  LIPASE, BLOOD    EKG None  Radiology Dg Chest 2 View  Result Date: 02/19/2019 CLINICAL DATA:  Pain after trauma EXAM: CHEST - 2 VIEW COMPARISON:  September 05, 2016 FINDINGS: The heart size and mediastinal contours are within normal limits. Both lungs are clear. The visualized skeletal structures are unremarkable. IMPRESSION: No active cardiopulmonary disease. Electronically Signed   By: Dorise Bullion III M.D   On: 02/19/2019 10:41   Ct Abdomen Pelvis W Contrast  Result Date: 02/19/2019 CLINICAL DATA:  Abdominal trauma blunt, abdominal pain EXAM: CT ABDOMEN AND PELVIS WITH CONTRAST TECHNIQUE: Multidetector CT imaging of the abdomen and pelvis was performed using the standard protocol following bolus administration of intravenous contrast. CONTRAST:  143mL OMNIPAQUE IOHEXOL 300 MG/ML  SOLN COMPARISON:  None. FINDINGS: Lower chest: The visualized heart size within normal limits. No pericardial fluid/thickening. No hiatal hernia. The visualized portions of the lungs are clear. Hepatobiliary: The liver is normal in density without focal abnormality.The main portal vein is patent. The patient is status post cholecystectomy. No biliary ductal dilation. Pancreas: Unremarkable. No pancreatic ductal dilatation or surrounding inflammatory changes. Spleen: Normal in size without focal abnormality. Adrenals/Urinary Tract: Both adrenal glands appear normal. The kidneys and collecting system appear normal without evidence of urinary tract calculus or hydronephrosis. Bladder is unremarkable. Stomach/Bowel: The patient is  status post gastric bypass. The remainder of the small bowel colon are size and contour. No bowel wall thickening.The appendix is normal. Vascular/Lymphatic: There are no enlarged mesenteric, retroperitoneal, or pelvic lymph nodes. No significant vascular findings are present. Reproductive: The uterus and adnexa are unremarkable. Other: No evidence of abdominal wall mass or hernia. There is a mild amount subcutaneous fat stranding changes seen over the left anterior abdominal wall best seen on series 3, image 57 Musculoskeletal: No acute or significant osseous findings. IMPRESSION: No acute intra-abdominal or pelvic pathology. Electronically Signed   By: Prudencio Pair M.D.   On: 02/19/2019 12:43    Procedures Procedures (including critical care time)  Medications Ordered in ED Medications  oxyCODONE-acetaminophen (  PERCOCET/ROXICET) 5-325 MG per tablet 1 tablet (1 tablet Oral Given 02/19/19 1047)  iohexol (OMNIPAQUE) 300 MG/ML solution 100 mL (100 mLs Intravenous Contrast Given 02/19/19 1223)     Initial Impression / Assessment and Plan / ED Course  I have reviewed the triage vital signs and the nursing notes.  Pertinent labs & imaging results that were available during my care of the patient were reviewed by me and considered in my medical decision making (see chart for details).        Labs: CBC, CMP, lipase  Imaging: DG chest, CT abdomen and pelvis with contrast  Consults:  Therapeutics:  Discharge Meds:   Assessment/Plan: 48 YOF with complaints of abdominal rib pain.  She has clear lung sounds tender abdomen.  Her CT is normal, labs are reassuring, no signs of acute rib fractures.  I did have a discussion of injuries in the home, patient assures that this was an accident and there is no component of assault.  Patient discharged with strict return cautions symptomatic care instructions.  She verbalized understanding and agreement to today's plan had no further questions or concerns at  the time discharge.   Final Clinical Impressions(s) / ED Diagnoses   Final diagnoses:  Generalized abdominal pain  Rib pain    ED Discharge Orders    None       Rosalio LoudHedges, Jull Harral, PA-C 02/19/19 1323    Tegeler, Canary Brimhristopher J, MD 02/19/19 2008

## 2019-02-19 NOTE — ED Triage Notes (Signed)
Pt was doing things at home Saturday on the floor. Boyfriend tripped and fell on her. Pt states she thinks she may broken ribs both side. Pt endorses SOB

## 2019-02-19 NOTE — ED Notes (Signed)
Patient transported to CT 

## 2019-02-19 NOTE — Discharge Instructions (Addendum)
Please read attached information. If you experience any new or worsening signs or symptoms please return to the emergency room for evaluation. Please follow-up with your primary care provider or specialist as discussed.  °

## 2019-02-27 ENCOUNTER — Other Ambulatory Visit: Payer: Self-pay

## 2019-02-27 ENCOUNTER — Ambulatory Visit: Payer: Medicaid Other | Attending: Family Medicine | Admitting: Physical Therapy

## 2019-02-27 DIAGNOSIS — R252 Cramp and spasm: Secondary | ICD-10-CM | POA: Insufficient documentation

## 2019-02-27 DIAGNOSIS — M25511 Pain in right shoulder: Secondary | ICD-10-CM | POA: Diagnosis not present

## 2019-02-27 DIAGNOSIS — M542 Cervicalgia: Secondary | ICD-10-CM | POA: Insufficient documentation

## 2019-02-27 NOTE — Therapy (Signed)
[address] [place], [place], [address] Phone: [phone]   Fax:  [phone]  Physical Therapy Treatment  Patient Details  Name: Carmen Rhodes MRN: [id number] Date of Birth: Aug 17, 1970 Referring Provider (PT): Bronson Curb   Encounter Date: 02/27/2019  PT End of Session - 02/27/19 1311    Visit Number  8    Number of Visits  12    Authorization Type  Medicaid    PT Start Time  1230    PT Stop Time  1300    PT Time Calculation (min)  30 min       Past Medical History:  Diagnosis Date  . Diverticulitis   . Genital herpes   . Pancreatitis     Past Surgical History:  Procedure Laterality Date  . ABLATION    . CESAREAN SECTION    . CHOLECYSTECTOMY    . KNEE SURGERY    . LAPAROSCOPIC GASTRIC BAND REMOVAL WITH LAPAROSCOPIC GASTRIC SLEEVE RESECTION    . TUBAL LIGATION      There were no vitals filed for this visit.  Subjective Assessment - 02/27/19 1236    Subjective  boyfriend fell on me last week so in ER with  broken ribs, no pian since on pain meds    Currently in Pain?  No/denies         [facility] PT Assessment - 02/27/19 0001      AROM   AROM Assessment Site  Shoulder    Right/Left Shoulder  Right    Right Shoulder Internal Rotation  70 Degrees      Strength   Overall Strength Comments  weakness in superspinatus                   [facility] Adult PT Treatment/Exercise - 02/27/19 0001      Neck Exercises: Machines for Strengthening   UBE (Upper Arm Bike)  L4 2 fwd/2back    Other Machines for Strengthening  rows and lats 20# 2 sets 12    Other Machines for Strengthening  chest press 15# 10 reps, serratus 10 reps      Neck Exercises: Standing   Other Standing Exercises  2# empty can and  PNF with verb and tactile cuing    Other Standing Exercises  pulleys ext and row 10# 2 sets 10      Neck Exercises: Seated   Upper Extremity D1  10 reps;Weights   2 sets    UE D1  Weights (lbs)  2    Other Seated Exercise  ER 5# pulley , IR 10# oulley 2 sets 10    Other Seated Exercise  2# empty can 2 sets 10                  PT Long Term Goals - 02/27/19 1311      PT LONG TERM GOAL #1   Title  independent with HEP    Status  Achieved      PT LONG TERM GOAL #2   Title  undertand posture and body mechanics    Status  Partially Met      PT LONG TERM GOAL #3   Title  increase cervical ROM to WNl's    Status  Achieved      PT LONG TERM GOAL #4   Title  decreaese pain 50%    Status  Achieved  Plan - 02/27/19 1312    Clinical Impression Statement  pain comes and goes. cerv ROM WNLS, shld WFLS except IR and weakness in superspinatus and compensation and pain with some ex needing cuing. pt verb independant with HEP    PT Treatment/Interventions  ADLs/Self Care Home Management;Cryotherapy;Electrical Stimulation;Iontophoresis 79m/ml Dexamethasone;Moist Heat;Traction;Ultrasound;Therapeutic activities;Therapeutic exercise;Neuromuscular re-education;Manual techniques;Dry needling;Patient/family education    PT Next Visit Plan  pt to call after seeing MD       Patient will benefit from skilled therapeutic intervention in order to improve the following deficits and impairments:  Improper body mechanics, Pain, Postural dysfunction, Impaired UE functional use, Decreased strength, Decreased range of motion, Difficulty walking, Impaired flexibility  Visit Diagnosis: Acute pain of right shoulder  Cramp and spasm  Cervicalgia     Problem List Patient Active Problem List   Diagnosis Date Noted  . DENTAL CARIES 03/10/2007  . PERIODONTITIS, ACUTE 03/10/2007  . OBESITY, MORBID 03/07/2007    Carmen Rhodes,Carmen Rhodes 02/27/2019, 1:13 PM  [address] [address]Phone: [phone]  Fax:  [phone] Name: Carmen SPITZLEYMRN: [id number] of Birth:  811-19-1972

## 2019-09-18 ENCOUNTER — Emergency Department (HOSPITAL_COMMUNITY): Payer: Medicaid Other

## 2019-09-18 ENCOUNTER — Emergency Department (HOSPITAL_COMMUNITY)
Admission: EM | Admit: 2019-09-18 | Discharge: 2019-09-18 | Disposition: A | Discharge status: Home / Self Care | Payer: Medicaid Other | Attending: Emergency Medicine | Admitting: Emergency Medicine

## 2019-09-18 ENCOUNTER — Encounter (HOSPITAL_COMMUNITY): Payer: Self-pay

## 2019-09-18 ENCOUNTER — Other Ambulatory Visit: Payer: Self-pay

## 2019-09-18 DIAGNOSIS — R55 Syncope and collapse: Secondary | ICD-10-CM | POA: Diagnosis present

## 2019-09-18 DIAGNOSIS — Z91040 Latex allergy status: Secondary | ICD-10-CM | POA: Insufficient documentation

## 2019-09-18 DIAGNOSIS — Z79899 Other long term (current) drug therapy: Secondary | ICD-10-CM | POA: Diagnosis not present

## 2019-09-18 LAB — CBC WITH DIFFERENTIAL/PLATELET
Abs Immature Granulocytes: 0.02 10*3/uL (ref 0.00–0.07)
Basophils Absolute: 0 10*3/uL (ref 0.0–0.1)
Basophils Relative: 0 %
Eosinophils Absolute: 0.1 10*3/uL (ref 0.0–0.5)
Eosinophils Relative: 1 %
HCT: 43.5 % (ref 36.0–46.0)
Hemoglobin: 13.9 g/dL (ref 12.0–15.0)
Immature Granulocytes: 0 %
Lymphocytes Relative: 16 %
Lymphs Abs: 1.1 10*3/uL (ref 0.7–4.0)
MCH: 27 pg (ref 26.0–34.0)
MCHC: 32 g/dL (ref 30.0–36.0)
MCV: 84.6 fL (ref 80.0–100.0)
Monocytes Absolute: 0.4 10*3/uL (ref 0.1–1.0)
Monocytes Relative: 6 %
Neutro Abs: 5.4 10*3/uL (ref 1.7–7.7)
Neutrophils Relative %: 77 %
Platelets: 258 10*3/uL (ref 150–400)
RBC: 5.14 MIL/uL — ABNORMAL HIGH (ref 3.87–5.11)
RDW: 15.3 % (ref 11.5–15.5)
WBC: 7.1 10*3/uL (ref 4.0–10.5)
nRBC: 0 % (ref 0.0–0.2)

## 2019-09-18 LAB — COMPREHENSIVE METABOLIC PANEL
ALT: 37 U/L (ref 0–44)
AST: 45 U/L — ABNORMAL HIGH (ref 15–41)
Albumin: 3.8 g/dL (ref 3.5–5.0)
Alkaline Phosphatase: 94 U/L (ref 38–126)
Anion gap: 11 (ref 5–15)
BUN: 12 mg/dL (ref 6–20)
CO2: 20 mmol/L — ABNORMAL LOW (ref 22–32)
Calcium: 9.2 mg/dL (ref 8.9–10.3)
Chloride: 112 mmol/L — ABNORMAL HIGH (ref 98–111)
Creatinine, Ser: 0.91 mg/dL (ref 0.44–1.00)
GFR calc Af Amer: >60 mL/min (ref >60)
GFR calc non Af Amer: >60 mL/min (ref >60)
Glucose, Bld: 91 mg/dL (ref 70–99)
Potassium: 3.3 mmol/L — ABNORMAL LOW (ref 3.5–5.1)
Sodium: 143 mmol/L (ref 135–145)
Total Bilirubin: 0.3 mg/dL (ref 0.3–1.2)
Total Protein: 7.1 g/dL (ref 6.5–8.1)

## 2019-09-18 LAB — CBG MONITORING, ED: Glucose-Capillary: 86 mg/dL (ref 70–99)

## 2019-09-18 MED ORDER — DIPHENHYDRAMINE HCL 50 MG/ML IJ SOLN
25.0000 mg | Freq: Once | INTRAMUSCULAR | Status: AC
  Administered 2019-09-18: 12:00:00 25 mg via INTRAVENOUS
  Filled 2019-09-18: qty 1

## 2019-09-18 MED ORDER — ACETAMINOPHEN 500 MG PO TABS
1000.0000 mg | ORAL_TABLET | Freq: Once | ORAL | Status: AC
  Administered 2019-09-18: 1000 mg via ORAL
  Filled 2019-09-18: qty 2

## 2019-09-18 MED ORDER — SODIUM CHLORIDE 0.9 % IV BOLUS
1000.0000 mL | Freq: Once | INTRAVENOUS | Status: AC
Start: 2019-09-18 — End: 2019-09-18
  Administered 2019-09-18: 1000 mL via INTRAVENOUS

## 2019-09-18 MED ORDER — PROCHLORPERAZINE EDISYLATE 10 MG/2ML IJ SOLN
10.0000 mg | Freq: Once | INTRAMUSCULAR | Status: AC
  Administered 2019-09-18: 10 mg via INTRAVENOUS
  Filled 2019-09-18: qty 2

## 2019-09-18 NOTE — Discharge Instructions (Signed)
Eat and drink as well as you can for the next 48 hours.  Please follow-up with your family doctor in the office. 

## 2019-09-18 NOTE — ED Provider Notes (Signed)
[facility] EMERGENCY DEPARTMENT Provider Note   CSN: [id number] Arrival date & time: 09/18/19  1114     History Chief Complaint  Patient presents with  . Loss of Consciousness  . Hypotension    Carmen Rhodes is a 49 y.o. female.  49 yo F with a chief complaint of a syncopal event.  Patient was at work today and was sitting down taking off her sweater when she suddenly felt bad.  She got sweaty and had to take her mask off.  Felt very warm and then woke up with people standing around her.  Denies any injury during this.  She has had some decreased oral intake for the past month without known cause.  Just says that she does not feel like eating.  Denies abdominal pain had one episode of vomiting during the event but otherwise denies vomiting.  Denies diarrhea or constipation.  Has a headache behind both of her eyes.  Gets headaches like this off and on.  Feels the same.  Slow in onset going on since this morning.  Denies trauma to the head denies neck pain.  Denies one-sided numbness or weakness denies difficulty speech or swallowing.  She denies chest pain or pressure.  Denies dark stool or blood in her stool.  Denies recent medication change.  Per EMS the patient was hypotensive on scene.  Improved with 400 cc IV fluids and Trendelenburg.  The history is provided by the patient.  Loss of Consciousness Episode history:  Single Most recent episode:  Today Duration:  30 seconds Timing:  Constant Progression:  Worsening Chronicity:  New Witnessed: no   Relieved by:  Nothing Worsened by:  Nothing Ineffective treatments:  None tried Associated symptoms: headaches   Associated symptoms: no chest pain, no dizziness, no fever, no nausea, no palpitations, no shortness of breath and no vomiting        Past Medical History:  Diagnosis Date  . Diverticulitis   . Genital herpes   . Pancreatitis     Patient Active Problem List   Diagnosis Date Noted  . DENTAL  CARIES 03/10/2007  . PERIODONTITIS, ACUTE 03/10/2007  . OBESITY, MORBID 03/07/2007    Past Surgical History:  Procedure Laterality Date  . ABLATION    . CESAREAN SECTION    . CHOLECYSTECTOMY    . KNEE SURGERY    . LAPAROSCOPIC GASTRIC BAND REMOVAL WITH LAPAROSCOPIC GASTRIC SLEEVE RESECTION    . TUBAL LIGATION       OB History   No obstetric history on file.     No family history on file.  Social History   Tobacco Use  . Smoking status: Never Smoker  . Smokeless tobacco: Never Used  Substance Use Topics  . Alcohol use: No  . Drug use: No    Home Medications Prior to Admission medications   Medication Sig Start Date End Date Taking? Authorizing Provider  atorvastatin (LIPITOR) 20 MG tablet Take 20 mg by mouth daily.    [provider]  HYDROcodone-acetaminophen (NORCO/VICODIN) 5-325 MG tablet Take 1 tablet by mouth every 4 (four) hours as needed for moderate pain. 12/14/18   Gilda Crease, MD  ibuprofen (ADVIL,MOTRIN) 800 MG tablet Take 1 tablet (800 mg total) by mouth 3 (three) times daily. 09/30/13   Elwin Mocha, MD  lidocaine (LIDODERM) 5 % Place 1 patch onto the skin daily. Remove & Discard patch within 12 hours or as directed by MD 12/14/18   Gilda Crease,  MD  lisinopril (PRINIVIL,ZESTRIL) 10 MG tablet Take 10 mg by mouth daily.    [provider]  oxyCODONE-acetaminophen (PERCOCET/ROXICET) 5-325 MG tablet Take 2 tablets by mouth every 4 (four) hours as needed for severe pain. 09/05/17   Glyn Ade, PA-C  sertraline (ZOLOFT) 100 MG tablet Take 200 mg by mouth daily.    [provider]  traMADol (ULTRAM) 50 MG tablet Take by mouth every 6 (six) hours as needed.    [provider]  valACYclovir (VALTREX) 500 MG tablet Take 500 mg by mouth 2 (two) times daily.    [provider]    Allergies    Ace inhibitors, Latex, and Nsaids  Review of Systems   Review of Systems  Constitutional: Positive for  appetite change. Negative for chills and fever.  HENT: Negative for congestion and rhinorrhea.   Eyes: Negative for redness and visual disturbance.  Respiratory: Negative for shortness of breath and wheezing.   Cardiovascular: Positive for syncope. Negative for chest pain and palpitations.  Gastrointestinal: Negative for nausea and vomiting.  Genitourinary: Negative for dysuria and urgency.  Musculoskeletal: Negative for arthralgias and myalgias.  Skin: Negative for pallor and wound.  Neurological: Positive for syncope and headaches. Negative for dizziness.    Physical Exam Updated Vital Signs BP 109/78   Pulse 80   Temp 98.4 F (36.9 C) (Oral)   Resp 16   Ht 5\' 5"  (1.651 m)   Wt 90.7 kg   SpO2 98%   BMI 33.28 kg/m   Physical Exam Vitals and nursing note reviewed.  Constitutional:      General: She is not in acute distress.    Appearance: She is well-developed. She is not diaphoretic.  HENT:     Head: Normocephalic and atraumatic.  Eyes:     Pupils: Pupils are equal, round, and reactive to light.  Cardiovascular:     Rate and Rhythm: Normal rate and regular rhythm.     Heart sounds: No murmur. No friction rub. No gallop.   Pulmonary:     Effort: Pulmonary effort is normal.     Breath sounds: No wheezing or rales.  Abdominal:     General: There is no distension.     Palpations: Abdomen is soft.     Tenderness: There is no abdominal tenderness.  Musculoskeletal:        General: No tenderness.     Cervical back: Normal range of motion and neck supple.  Skin:    General: Skin is warm and dry.  Neurological:     Mental Status: She is alert and oriented to person, place, and time.     Comments: Benign neurologic exam  Psychiatric:        Behavior: Behavior normal.     ED Results / Procedures / Treatments   Labs (all labs ordered are listed, but only abnormal results are displayed) Labs Reviewed  CBC WITH DIFFERENTIAL/PLATELET - Abnormal; Notable for the  following components:      Result Value   RBC 5.14 (*)    All other components within normal limits  COMPREHENSIVE METABOLIC PANEL - Abnormal; Notable for the following components:   Potassium 3.3 (*)    Chloride 112 (*)    CO2 20 (*)    AST 45 (*)    All other components within normal limits  CBG MONITORING, ED    EKG EKG Interpretation  Date/Time:  Tuesday September 18 2019 11:21:04 EDT Ventricular Rate:  74 PR Interval:  QRS Duration: 105 QT Interval:  406 QTC Calculation: 451 R Axis:   -30 Text Interpretation: Sinus rhythm Left axis deviation Abnormal R-wave progression, late transition No old tracing to compare Confirmed by Melene Plan [id number]) on 09/18/2019 11:23:07 AM   Radiology CT Head Wo Contrast  Result Date: 09/18/2019 CLINICAL DATA:  Syncopal episode, single episode of post syncope vomiting, decreased appetite for 1 month, headache EXAM: CT HEAD WITHOUT CONTRAST TECHNIQUE: Contiguous axial images were obtained from the base of the skull through the vertex without intravenous contrast. Sagittal and coronal MPR images reconstructed from axial data set. COMPARISON:  11/23/2016 FINDINGS: Brain: Normal ventricular morphology. No midline shift or mass effect. Normal appearance of brain parenchyma. No intracranial hemorrhage, mass lesion, evidence of acute infarction, or extra-axial fluid collection. Vascular: No hyperdense vessels Skull: Intact Sinuses/Orbits: Clear Other: N/A IMPRESSION: Normal exam. Electronically Signed   By: Ulyses Southward M.D.   On: 09/18/2019 12:48    Procedures Procedures (including critical care time)  Medications Ordered in ED Medications  acetaminophen (TYLENOL) tablet 1,000 mg (1,000 mg Oral Given 09/18/19 1203)  sodium chloride 0.9 % bolus 1,000 mL (0 mLs Intravenous Stopped 09/18/19 1319)  prochlorperazine (COMPAZINE) injection 10 mg (10 mg Intravenous Given 09/18/19 1203)  diphenhydrAMINE (BENADRYL) injection 25 mg (25 mg Intravenous Given 09/18/19  1203)    ED Course  I have reviewed the triage vital signs and the nursing notes.  Pertinent labs & imaging results that were available during my care of the patient were reviewed by me and considered in my medical decision making (see chart for details).    MDM Rules/Calculators/A&P                      49 yo F with a chief complaint of a syncopal event.  Sounds vasovagal by history.  Patient reportedly was hypotensive initially on scene.  Hypotensive on arrival.  Will give a bolus of IV fluids with history of decreased oral intake.  Check basic lab work.  CT scan of the head for patient having a headache plus syncope.  CT the head is negative.  Lab work is unremarkable.  Patient is able to ambulate without difficulty.  Discharge home.  1:31 PM:  I have discussed the diagnosis/risks/treatment options with the patient and believe the pt to be eligible for discharge home to follow-up with PCP. We also discussed returning to the ED immediately if new or worsening sx occur. We discussed the sx which are most concerning (e.g., sudden worsening pain, fever, inability to tolerate by mouth ) that necessitate immediate return. Medications administered to the patient during their visit and any new prescriptions provided to the patient are listed below.  Medications given during this visit Medications  acetaminophen (TYLENOL) tablet 1,000 mg (1,000 mg Oral Given 09/18/19 1203)  sodium chloride 0.9 % bolus 1,000 mL (0 mLs Intravenous Stopped 09/18/19 1319)  prochlorperazine (COMPAZINE) injection 10 mg (10 mg Intravenous Given 09/18/19 1203)  diphenhydrAMINE (BENADRYL) injection 25 mg (25 mg Intravenous Given 09/18/19 1203)     The patient appears reasonably screen and/or stabilized for discharge and I doubt any other medical condition or other [facility] requiring further screening, evaluation, or treatment in the ED at this time prior to discharge.   Final Clinical Impression(s) / ED Diagnoses Final  diagnoses:  Syncope and collapse    Rx / DC Orders ED Discharge Orders    None       Melene Plan, DO 09/18/19 1331

## 2019-09-18 NOTE — ED Notes (Signed)
Pt ambulated to bathroom, no assistance needed.

## 2019-09-18 NOTE — ED Triage Notes (Signed)
Pt bib [facility] from therapist's office after syncopal episode that lasted about 5 mins. Pt reports she was sitting down and removing sweater and began to feel bad.  Therapist left to get water and came back to pt unconscious. One episode of vomiting after syncopal episode. EMS reports pt was A&Ox4 on arrival and en route. BP 107/76 sitting, 95/64 standing with EMS. Pt currently denies chest pain, n/v. Reports decreased appetite x1 month. Hx of gastric sleeve sx (2017). Pt currently A&O x4, GCS 15, VSS.

## 2020-09-09 DIAGNOSIS — N62 Hypertrophy of breast: Secondary | ICD-10-CM | POA: Insufficient documentation

## 2020-11-24 DIAGNOSIS — Z96651 Presence of right artificial knee joint: Secondary | ICD-10-CM | POA: Insufficient documentation

## 2021-01-13 DIAGNOSIS — M1711 Unilateral primary osteoarthritis, right knee: Secondary | ICD-10-CM | POA: Insufficient documentation

## 2021-01-14 IMAGING — CT CT HEAD W/O CM
4 series · 16 of 47 positions shown, 18 images · non-contrast
Comparison: 11/23/2016

CLINICAL DATA: Syncopal episode, single episode of post syncope
vomiting, decreased appetite for 1 month, headache

EXAM:
CT HEAD WITHOUT CONTRAST
TECHNIQUE: Contiguous axial images were obtained from the base of the skull
through the vertex without intravenous contrast. Sagittal and
coronal MPR images reconstructed from axial data set.

[Series 3: head without · axial · non-contrast · 0.44mm/px · z∈[+75,+195]mm · 7 of 33 slices shown, 9 images]
[im 5/33  brain]
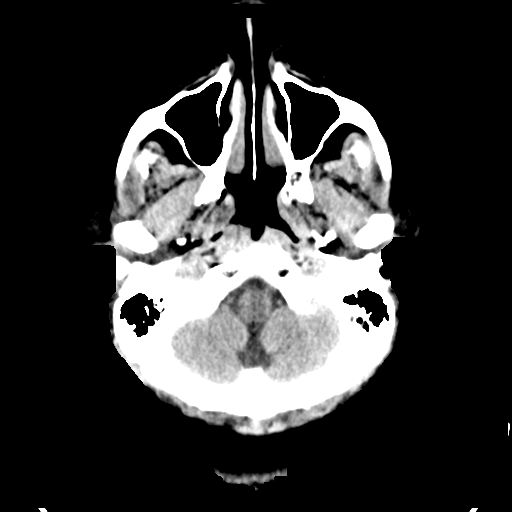
[im 5/33  bone]
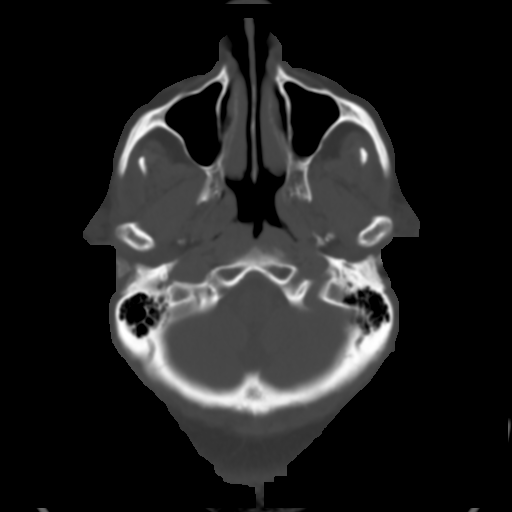
[im 9/33  brain]
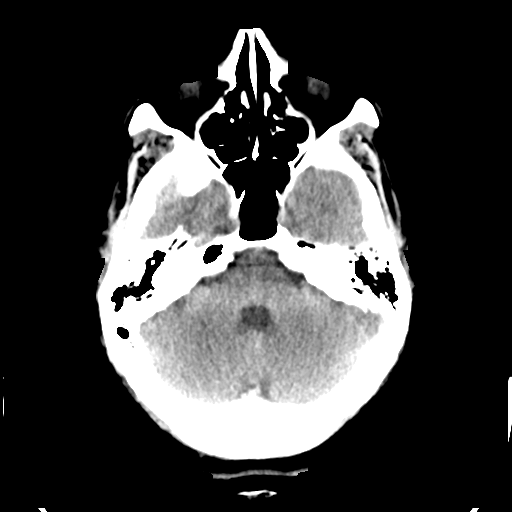
[im 13/33  brain]
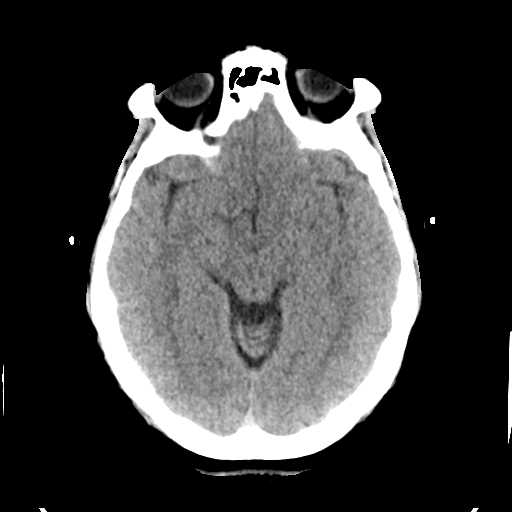
[im 17/33  brain]
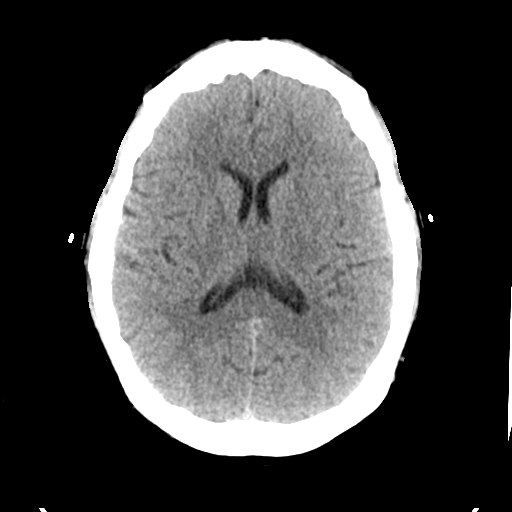
[im 21/33  brain]
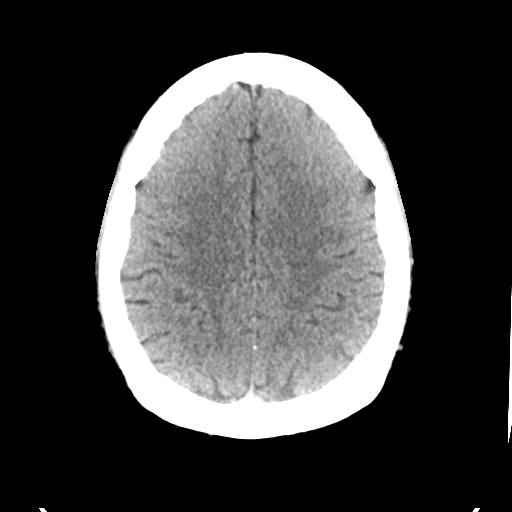
[im 21/33  bone]
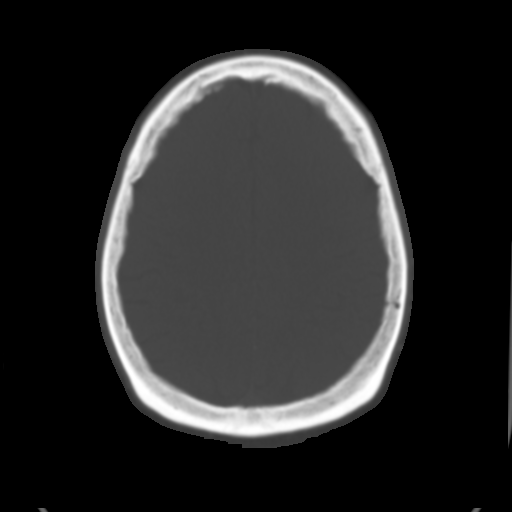
[im 25/33  brain]
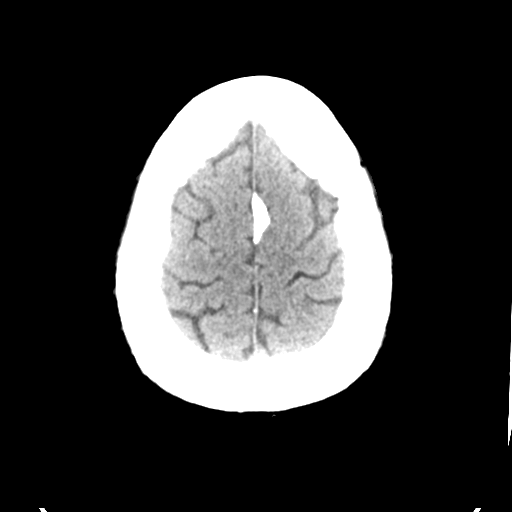
[im 29/33  brain]
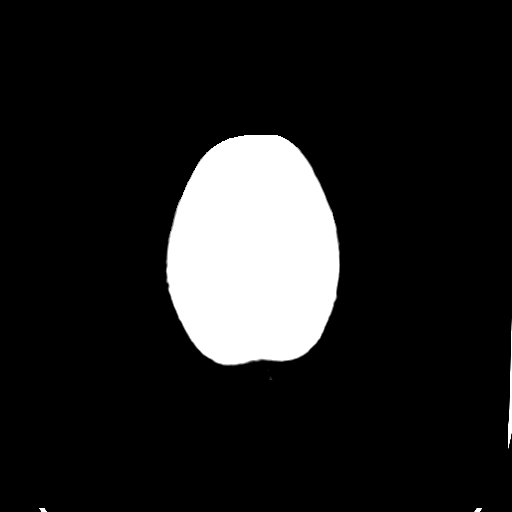

[Series 4: head bone · axial · 0.44mm/px · z∈[+71,+103]mm · 3 of 82 slices shown]
[im 9/82  bone]
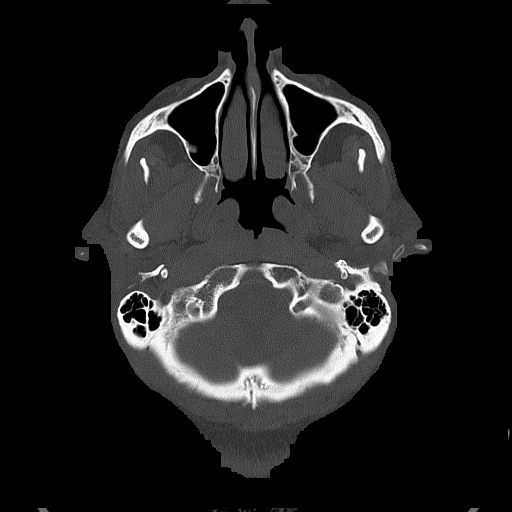
[im 17/82  bone]
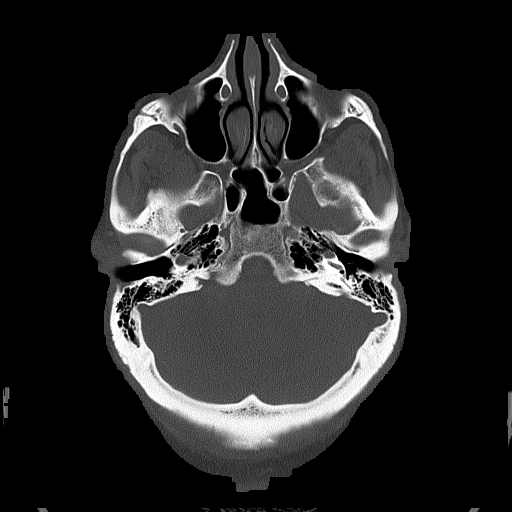
[im 25/82  bone]
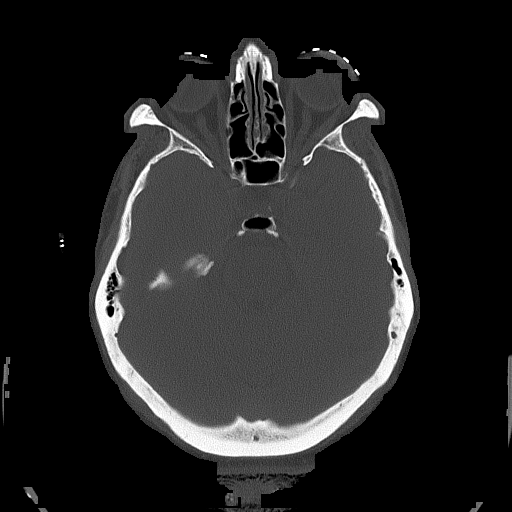

[Series 5: head without cor · coronal · non-contrast · 0.32mm/px · 3 of 69 slices shown]
[im 23/69  brain]
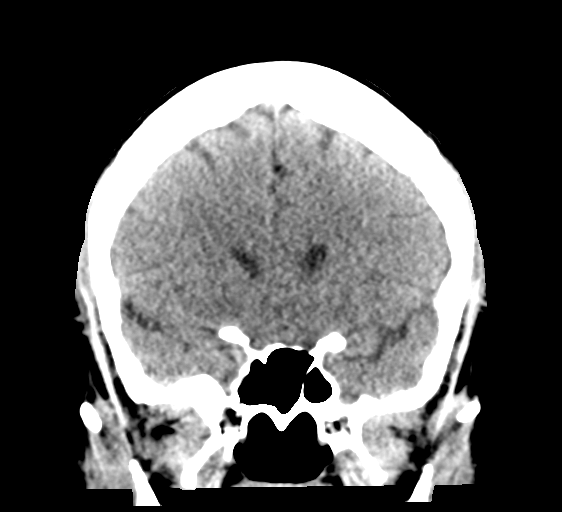
[im 31/69  brain]
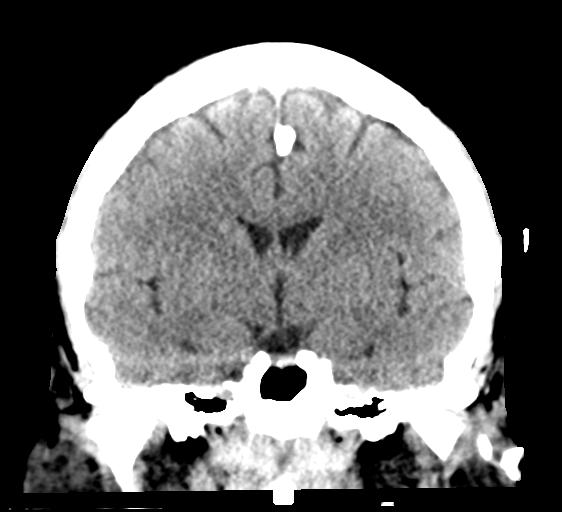
[im 38/69  brain]
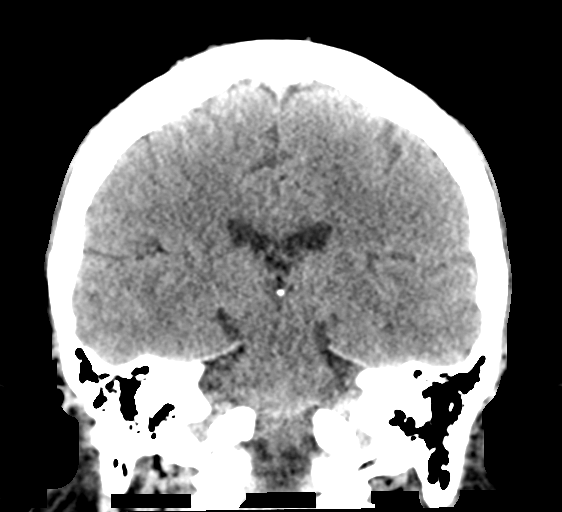

[Series 6: head without sag · sagittal · non-contrast · 0.32mm/px · 3 of 59 slices shown]
[im 20/59  brain]
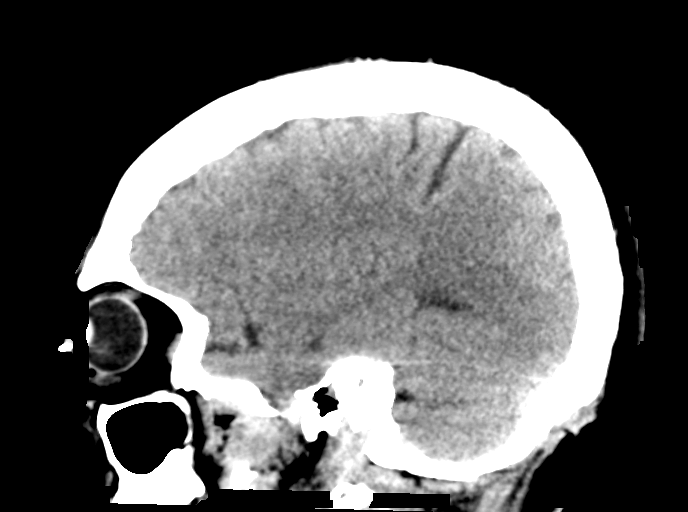
[im 30/59  brain]
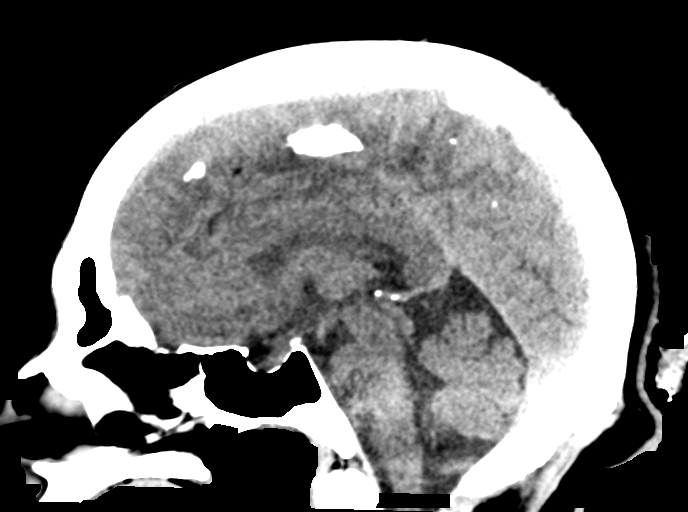
[im 39/59  brain]
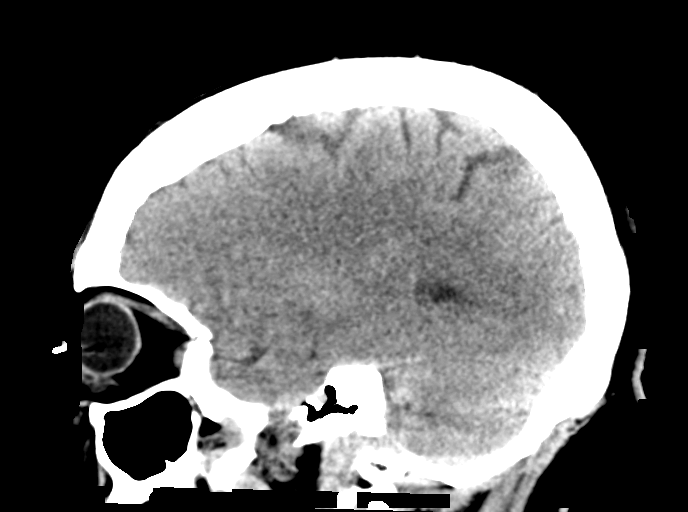

[16 of 47 positions shown; findings below may reference images not displayed]

FINDINGS: Brain: Normal ventricular morphology. No midline shift or mass
effect. Normal appearance of brain parenchyma. No intracranial
hemorrhage, mass lesion, evidence of acute infarction, or
extra-axial fluid collection.

Vascular: No hyperdense vessels

Skull: Intact

Sinuses/Orbits: Clear

Other: N/A
IMPRESSION: Normal exam.

## 2021-06-21 HISTORY — PX: REPLACEMENT TOTAL KNEE: SUR1224

## 2021-06-21 HISTORY — PX: REFRACTIVE SURGERY: SHX103

## 2021-11-20 ENCOUNTER — Emergency Department (HOSPITAL_BASED_OUTPATIENT_CLINIC_OR_DEPARTMENT_OTHER)
Admission: EM | Admit: 2021-11-20 | Discharge: 2021-11-20 | Disposition: A | Discharge status: Home / Self Care | Payer: No Typology Code available for payment source | Attending: Emergency Medicine | Admitting: Emergency Medicine

## 2021-11-20 ENCOUNTER — Encounter (HOSPITAL_BASED_OUTPATIENT_CLINIC_OR_DEPARTMENT_OTHER): Payer: Self-pay | Admitting: Emergency Medicine

## 2021-11-20 ENCOUNTER — Other Ambulatory Visit: Payer: Self-pay

## 2021-11-20 DIAGNOSIS — Y92410 Unspecified street and highway as the place of occurrence of the external cause: Secondary | ICD-10-CM | POA: Insufficient documentation

## 2021-11-20 DIAGNOSIS — S161XXA Strain of muscle, fascia and tendon at neck level, initial encounter: Secondary | ICD-10-CM | POA: Diagnosis not present

## 2021-11-20 DIAGNOSIS — Z91040 Latex allergy status: Secondary | ICD-10-CM | POA: Insufficient documentation

## 2021-11-20 DIAGNOSIS — M542 Cervicalgia: Secondary | ICD-10-CM | POA: Diagnosis present

## 2021-11-20 MED ORDER — METHOCARBAMOL 500 MG PO TABS: 500.0000 mg | ORAL_TABLET | Freq: Two times a day (BID) | ORAL | 0 refills | Status: AC
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
## 2022-01-14 DIAGNOSIS — R7303 Prediabetes: Secondary | ICD-10-CM | POA: Insufficient documentation

## 2022-12-12 ENCOUNTER — Emergency Department (HOSPITAL_BASED_OUTPATIENT_CLINIC_OR_DEPARTMENT_OTHER)
Admission: EM | Admit: 2022-12-12 | Discharge: 2022-12-12 | Disposition: A | Discharge status: Home / Self Care | Payer: Medicaid Other | Attending: Emergency Medicine | Admitting: Emergency Medicine

## 2022-12-12 ENCOUNTER — Emergency Department (HOSPITAL_COMMUNITY)
Admission: EM | Admit: 2022-12-12 | Discharge: 2022-12-12 | Discharge status: Against Medical Advice | Payer: No Typology Code available for payment source | Source: Home / Self Care

## 2022-12-12 ENCOUNTER — Encounter (HOSPITAL_BASED_OUTPATIENT_CLINIC_OR_DEPARTMENT_OTHER): Payer: Self-pay | Admitting: Emergency Medicine

## 2022-12-12 ENCOUNTER — Other Ambulatory Visit: Payer: Self-pay

## 2022-12-12 ENCOUNTER — Emergency Department (HOSPITAL_BASED_OUTPATIENT_CLINIC_OR_DEPARTMENT_OTHER): Payer: Medicaid Other

## 2022-12-12 DIAGNOSIS — Z91040 Latex allergy status: Secondary | ICD-10-CM | POA: Insufficient documentation

## 2022-12-12 DIAGNOSIS — R109 Unspecified abdominal pain: Secondary | ICD-10-CM

## 2022-12-12 DIAGNOSIS — R1011 Right upper quadrant pain: Secondary | ICD-10-CM | POA: Insufficient documentation

## 2022-12-12 LAB — CBC WITH DIFFERENTIAL/PLATELET
Abs Immature Granulocytes: 0.02 10*3/uL (ref 0.00–0.07)
Basophils Absolute: 0 10*3/uL (ref 0.0–0.1)
Basophils Relative: 1 %
Eosinophils Absolute: 0 10*3/uL (ref 0.0–0.5)
Eosinophils Relative: 0 %
HCT: 43.2 % (ref 36.0–46.0)
Hemoglobin: 14 g/dL (ref 12.0–15.0)
Immature Granulocytes: 0 %
Lymphocytes Relative: 35 %
Lymphs Abs: 2 10*3/uL (ref 0.7–4.0)
MCH: 27.9 pg (ref 26.0–34.0)
MCHC: 32.4 g/dL (ref 30.0–36.0)
MCV: 86.1 fL (ref 80.0–100.0)
Monocytes Absolute: 0.5 10*3/uL (ref 0.1–1.0)
Monocytes Relative: 8 %
Neutro Abs: 3.3 10*3/uL (ref 1.7–7.7)
Neutrophils Relative %: 56 %
Platelets: 277 10*3/uL (ref 150–400)
RBC: 5.02 MIL/uL (ref 3.87–5.11)
RDW: 15.4 % (ref 11.5–15.5)
WBC: 5.9 10*3/uL (ref 4.0–10.5)
nRBC: 0 % (ref 0.0–0.2)

## 2022-12-12 LAB — URINALYSIS, ROUTINE W REFLEX MICROSCOPIC
Bilirubin Urine: NEGATIVE
Glucose, UA: NEGATIVE mg/dL
Hgb urine dipstick: NEGATIVE
Ketones, ur: NEGATIVE mg/dL
Leukocytes,Ua: NEGATIVE
Nitrite: NEGATIVE
Protein, ur: NEGATIVE mg/dL
Specific Gravity, Urine: 1.025 (ref 1.005–1.030)
pH: 7 (ref 5.0–8.0)

## 2022-12-12 LAB — COMPREHENSIVE METABOLIC PANEL
ALT: 25 U/L (ref 0–44)
AST: 24 U/L (ref 15–41)
Albumin: 3.8 g/dL (ref 3.5–5.0)
Alkaline Phosphatase: 117 U/L (ref 38–126)
Anion gap: 9 (ref 5–15)
BUN: 18 mg/dL (ref 6–20)
CO2: 25 mmol/L (ref 22–32)
Calcium: 9.6 mg/dL (ref 8.9–10.3)
Chloride: 105 mmol/L (ref 98–111)
Creatinine, Ser: 0.75 mg/dL (ref 0.44–1.00)
GFR, Estimated: >60 mL/min (ref >60)
Glucose, Bld: 94 mg/dL (ref 70–99)
Potassium: 4 mmol/L (ref 3.5–5.1)
Sodium: 139 mmol/L (ref 135–145)
Total Bilirubin: 0.6 mg/dL (ref 0.3–1.2)
Total Protein: 7.5 g/dL (ref 6.5–8.1)

## 2022-12-12 LAB — CBC
HCT: 43.7 % (ref 36.0–46.0)
Hemoglobin: 14.1 g/dL (ref 12.0–15.0)
MCH: 27.8 pg (ref 26.0–34.0)
MCHC: 32.3 g/dL (ref 30.0–36.0)
MCV: 86.2 fL (ref 80.0–100.0)
Platelets: 278 10*3/uL (ref 150–400)
RBC: 5.07 MIL/uL (ref 3.87–5.11)
RDW: 15.4 % (ref 11.5–15.5)
WBC: 6.1 10*3/uL (ref 4.0–10.5)
nRBC: 0 % (ref 0.0–0.2)

## 2022-12-12 LAB — LIPASE, BLOOD: Lipase: 35 U/L (ref 11–51)

## 2022-12-12 MED ORDER — DICYCLOMINE HCL 10 MG PO CAPS
10.0000 mg | ORAL_CAPSULE | Freq: Once | ORAL | Status: AC
  Administered 2022-12-12: 10 mg via ORAL
  Filled 2022-12-12: qty 1

## 2022-12-12 MED ORDER — IOHEXOL 300 MG/ML  SOLN
100.0000 mL | Freq: Once | INTRAMUSCULAR | Status: AC | PRN
  Administered 2022-12-12: 100 mL via INTRAVENOUS

## 2022-12-12 MED ORDER — DICYCLOMINE HCL 20 MG PO TABS: 20.0000 mg | ORAL_TABLET | Freq: Four times a day (QID) | ORAL | 0 refills | Status: DC | PRN

## 2022-12-12 NOTE — ED Notes (Addendum)
Pt given urine cup and asked for sample.  

## 2022-12-12 NOTE — ED Provider Notes (Signed)
[facility] Provider Note: Lowella Dell, MD, FACEP  CSN: [id number] MRN: [id number] ARRIVAL: 12/12/22 at 0406 ROOM: MH09/MH09   CHIEF COMPLAINT  Abdominal Pain   HISTORY OF PRESENT ILLNESS  12/12/22 4:52 AM Carmen Rhodes is a 52 y.o. female with right upper quadrant abdominal pain since yesterday afternoon.  She initially went to [facility] but left due to wait times.  The pain is intermittent and comes in waves.  She rates the pain as a 10 out of 10 at its worst.  Pain is mild at the present time.  It is described as a burning or hot pain.  She denies nausea, vomiting, diarrhea, fever or chills but did feel "clammy" earlier.  She is status post cholecystectomy.    Past Medical History:  Diagnosis Date   Diverticulitis    Genital herpes    Pancreatitis     Past Surgical History:  Procedure Laterality Date   ABLATION     CESAREAN SECTION     CHOLECYSTECTOMY     KNEE SURGERY     LAPAROSCOPIC GASTRIC BAND REMOVAL WITH LAPAROSCOPIC GASTRIC SLEEVE RESECTION     PANNICULECTOMY     REFRACTIVE SURGERY Right    TUBAL LIGATION     UMBILICAL HERNIA REPAIR      History reviewed. No pertinent family history.  Social History   Tobacco Use   Smoking status: Never   Smokeless tobacco: Never  Vaping Use   Vaping Use: Never used  Substance Use Topics   Alcohol use: No   Drug use: No    Prior to Admission medications   Medication Sig Start Date End Date Taking? Authorizing Provider  Biotin 5000 MCG CAPS Take 5,000 capsules by mouth daily.   Yes [provider]  cetirizine (ZYRTEC) 10 MG tablet Take 10 mg by mouth daily.   Yes [provider]  Cholecalciferol (VITAMIN D-3) 125 MCG (5000 UT) TABS Take 125 mcg by mouth daily.   Yes [provider]  cyanocobalamin (VITAMIN B12) 1000 MCG tablet Take 1,000 mcg by mouth daily.   Yes [provider]  dicyclomine (BENTYL) 20 MG tablet Take 1 tablet (20 mg total) by mouth every 6 (six)  hours as needed (for abdominal cramping). 12/12/22  Yes Ziomara Birenbaum, MD  Diethylpropion HCl CR 75 MG TB24 Take by mouth.   Yes [provider]  FISH OIL-KRILL OIL PO Take 2,000 mg by mouth.   Yes [provider]  sertraline (ZOLOFT) 100 MG tablet Take 100 mg by mouth daily.   Yes [provider]  topiramate (TOPAMAX) 50 MG tablet Take 50 mg by mouth 2 (two) times daily.   Yes [provider]  atorvastatin (LIPITOR) 20 MG tablet Take 20 mg by mouth daily.    [provider]  HYDROcodone-acetaminophen (NORCO/VICODIN) 5-325 MG tablet Take 1 tablet by mouth every 4 (four) hours as needed for moderate pain. 12/14/18   Gilda Crease, MD  ibuprofen (ADVIL,MOTRIN) 800 MG tablet Take 1 tablet (800 mg total) by mouth 3 (three) times daily. 09/30/13   Elwin Mocha, MD  lidocaine (LIDODERM) 5 % Place 1 patch onto the skin daily. Remove & Discard patch within 12 hours or as directed by MD 12/14/18   Gilda Crease, MD  lisinopril (PRINIVIL,ZESTRIL) 10 MG tablet Take 10 mg by mouth daily.    [provider]  oxyCODONE-acetaminophen (PERCOCET/ROXICET) 5-325 MG tablet Take 2 tablets by mouth every 4 (four) hours as needed for  severe pain. 09/05/17   Kellie Shropshire, PA-C  sertraline (ZOLOFT) 100 MG tablet Take 200 mg by mouth daily.    [provider]  traMADol (ULTRAM) 50 MG tablet Take by mouth every 6 (six) hours as needed.    [provider]  valACYclovir (VALTREX) 500 MG tablet Take 500 mg by mouth 2 (two) times daily.    [provider]    Allergies Ace inhibitors, Latex, and Nsaids   REVIEW OF SYSTEMS  Negative except as noted here or in the History of Present Illness.   PHYSICAL EXAMINATION  Initial Vital Signs Blood pressure (!) 140/97, pulse 85, temperature 97.7 F (36.5 C), resp. rate 20, height 5\' 5"  (1.651 m), weight 113.4 kg, SpO2 98 %.  Examination General: Well-developed, well-nourished  female in no acute distress; appearance consistent with age of record HENT: normocephalic; atraumatic Eyes: Normal appearance Neck: supple Heart: regular rate and rhythm Lungs: clear to auscultation bilaterally Abdomen: soft; nondistended; nontender; bowel sounds present Extremities: No deformity; full range of motion; pulses normal Neurologic: Awake, alert and oriented; motor function intact in all extremities and symmetric; no facial droop Skin: Warm and dry Psychiatric: Normal mood and affect   RESULTS  Summary of this visit's results, reviewed and interpreted by myself:   EKG Interpretation  Date/Time:  Sunday December 12 2022 04:37:44 EDT Ventricular Rate:  81 PR Interval:  163 QRS Duration: 83 QT Interval:  375 QTC Calculation: 436 R Axis:   -42 Text Interpretation: Sinus rhythm Left anterior fascicular block Abnormal R-wave progression, late transition Left ventricular hypertrophy No significant change was found Confirmed by Teancum Brule, Jonny Ruiz ([id number]) on 12/12/2022 4:51:57 AM       Laboratory Studies: Results for orders placed or performed during the hospital encounter of 12/12/22 (from the past 24 hour(s))  Lipase, blood     Status: None   Collection Time: 12/12/22  4:33 AM  Result Value Ref Range   Lipase 35 11 - 51 U/L  Comprehensive metabolic panel     Status: None   Collection Time: 12/12/22  4:33 AM  Result Value Ref Range   Sodium 139 135 - 145 mmol/L   Potassium 4.0 3.5 - 5.1 mmol/L   Chloride 105 98 - 111 mmol/L   CO2 25 22 - 32 mmol/L   Glucose, Bld 94 70 - 99 mg/dL   BUN 18 6 - 20 mg/dL   Creatinine, Ser 1.30 0.44 - 1.00 mg/dL   Calcium 9.6 8.9 - 86.5 mg/dL   Total Protein 7.5 6.5 - 8.1 g/dL   Albumin 3.8 3.5 - 5.0 g/dL   AST 24 15 - 41 U/L   ALT 25 0 - 44 U/L   Alkaline Phosphatase 117 38 - 126 U/L   Total Bilirubin 0.6 0.3 - 1.2 mg/dL   GFR, Estimated >78 >46 mL/min   Anion gap 9 5 - 15  CBC     Status: None   Collection Time: 12/12/22  4:33 AM   Result Value Ref Range   WBC 6.1 4.0 - 10.5 K/uL   RBC 5.07 3.87 - 5.11 MIL/uL   Hemoglobin 14.1 12.0 - 15.0 g/dL   HCT 96.2 95.2 - 84.1 %   MCV 86.2 80.0 - 100.0 fL   MCH 27.8 26.0 - 34.0 pg   MCHC 32.3 30.0 - 36.0 g/dL   RDW 32.4 40.1 - 02.7 %   Platelets 278 150 - 400 K/uL   nRBC 0.0 0.0 - 0.2 %  CBC with  Differential/Platelet     Status: None   Collection Time: 12/12/22  4:33 AM  Result Value Ref Range   WBC 5.9 4.0 - 10.5 K/uL   RBC 5.02 3.87 - 5.11 MIL/uL   Hemoglobin 14.0 12.0 - 15.0 g/dL   HCT 40.9 81.1 - 91.4 %   MCV 86.1 80.0 - 100.0 fL   MCH 27.9 26.0 - 34.0 pg   MCHC 32.4 30.0 - 36.0 g/dL   RDW 78.2 95.6 - 21.3 %   Platelets 277 150 - 400 K/uL   nRBC 0.0 0.0 - 0.2 %   Neutrophils Relative % 56 %   Neutro Abs 3.3 1.7 - 7.7 K/uL   Lymphocytes Relative 35 %   Lymphs Abs 2.0 0.7 - 4.0 K/uL   Monocytes Relative 8 %   Monocytes Absolute 0.5 0.1 - 1.0 K/uL   Eosinophils Relative 0 %   Eosinophils Absolute 0.0 0.0 - 0.5 K/uL   Basophils Relative 1 %   Basophils Absolute 0.0 0.0 - 0.1 K/uL   Immature Granulocytes 0 %   Abs Immature Granulocytes 0.02 0.00 - 0.07 K/uL  Urinalysis, Routine w reflex microscopic -Urine, Clean Catch     Status: Abnormal   Collection Time: 12/12/22  5:15 AM  Result Value Ref Range   Color, Urine YELLOW YELLOW   APPearance HAZY (A) CLEAR   Specific Gravity, Urine 1.025 1.005 - 1.030   pH 7.0 5.0 - 8.0   Glucose, UA NEGATIVE NEGATIVE mg/dL   Hgb urine dipstick NEGATIVE NEGATIVE   Bilirubin Urine NEGATIVE NEGATIVE   Ketones, ur NEGATIVE NEGATIVE mg/dL   Protein, ur NEGATIVE NEGATIVE mg/dL   Nitrite NEGATIVE NEGATIVE   Leukocytes,Ua NEGATIVE NEGATIVE   Imaging Studies: CT ABDOMEN PELVIS W CONTRAST  Result Date: 12/12/2022 CLINICAL DATA:  Epigastric pain since Saturday afternoon EXAM: CT ABDOMEN AND PELVIS WITH CONTRAST TECHNIQUE: Multidetector CT imaging of the abdomen and pelvis was performed using the standard protocol following  bolus administration of intravenous contrast. RADIATION DOSE REDUCTION: This exam was performed according to the departmental dose-optimization program which includes automated exposure control, adjustment of the mA and/or kV according to patient size and/or use of iterative reconstruction technique. CONTRAST:  OMNIPAQUE IOHEXOL 300 MG/ML  SOLN COMPARISON:  Head CT 02/19/2019 FINDINGS: Lower chest:  No contributory findings. Hepatobiliary: No focal liver abnormality.Cholecystectomy. No biliary dilatation Pancreas: Unremarkable. Spleen: Unremarkable. Adrenals/Urinary Tract: Negative adrenals. No hydronephrosis or stone. Unremarkable bladder. Stomach/Bowel: No obstruction. No appendicitis. Numerous colonic diverticula. Postoperative stomach, likely gastric sleeve. Vascular/Lymphatic: No acute vascular abnormality. No mass or adenopathy. Reproductive:No pathologic findings. Other: No ascites or pneumoperitoneum. Musculoskeletal: No acute abnormalities. Endplate and facet spurring in the lower thoracic and lumbar spine. Chronic L5 superior endplate fracture. IMPRESSION: No acute finding or explanation for symptoms. Electronically Signed   By: Tiburcio Pea M.D.   On: 12/12/2022 05:49    ED COURSE and MDM  Nursing notes, initial and subsequent vitals signs, including pulse oximetry, reviewed and interpreted by myself.  Vitals:   12/12/22 0419 12/12/22 0420  BP: (!) 140/97   Pulse: 85   Resp: 20   Temp: 97.7 F (36.5 C)   SpO2: 98%   Weight:  113.4 kg  Height:  5\' 5"  (1.651 m)   Medications  dicyclomine (BENTYL) capsule 10 mg (has no administration in time range)  iohexol (OMNIPAQUE) 300 MG/ML solution 100 mL (100 mLs Intravenous Contrast Given 12/12/22 0527)   6:04 AM Patient advised of reassuring lab studies and CT  scan.  The cause of her abdominal cramps/spasms is unclear.  There is no excessive gas seen on her CT.  We will treat her with Bentyl for symptomatic relief.  She was advised to  return if symptoms worsen.   PROCEDURES  Procedures   ED DIAGNOSES     ICD-10-CM   1. Abdominal spasms  R10.9          Latroy Gaymon, Jonny Ruiz, MD 12/12/22 [phone]

## 2022-12-12 NOTE — ED Notes (Signed)
Patient transported to CT 

## 2022-12-12 NOTE — ED Notes (Signed)
ED Provider at bedside to re-assess patient  

## 2022-12-12 NOTE — ED Triage Notes (Signed)
Pt reports intermittent RUQ abd pain since Saturday afternoon (yesterday). She states she initially went to [facility] but left due to wait times. No labs or imaging taken at that visit. Denies n/v, fever, or bowel changes. States she did feel "clammy" with her pain earlier, but not presently. Hx of hernia repair and cholecystectomy. Characterizes pain as "burning/heat" and rates it at 10/10. She reports that pain is either 10 or 0, no gradual build.

## 2023-02-04 ENCOUNTER — Other Ambulatory Visit: Payer: Self-pay | Admitting: Podiatry

## 2023-02-04 ENCOUNTER — Ambulatory Visit (INDEPENDENT_AMBULATORY_CARE_PROVIDER_SITE_OTHER): Payer: MEDICAID

## 2023-02-04 ENCOUNTER — Encounter: Payer: Self-pay | Admitting: Podiatry

## 2023-02-04 ENCOUNTER — Ambulatory Visit (INDEPENDENT_AMBULATORY_CARE_PROVIDER_SITE_OTHER): Payer: MEDICAID | Admitting: Podiatry

## 2023-02-04 DIAGNOSIS — M205X1 Other deformities of toe(s) (acquired), right foot: Secondary | ICD-10-CM | POA: Diagnosis not present

## 2023-02-04 DIAGNOSIS — Z01818 Encounter for other preprocedural examination: Secondary | ICD-10-CM | POA: Diagnosis not present

## 2023-02-04 DIAGNOSIS — M216X1 Other acquired deformities of right foot: Secondary | ICD-10-CM | POA: Diagnosis not present

## 2023-02-04 DIAGNOSIS — Z79899 Other long term (current) drug therapy: Secondary | ICD-10-CM

## 2023-02-04 NOTE — Progress Notes (Signed)
Subjective:  Patient ID: Carmen Rhodes, female    DOB: 1970-07-12,  MRN: [id number]  Chief Complaint  Patient presents with   Nail Problem    Hallux right - thick, discolored nail x years, getting worse, tired trimming and OTC cream-no help   Foot Pain    1st MPJ right - knot dorsally x several months, redness, but no pain   New Patient (Initial Visit)    52 y.o. female presents with the above complaint.  Patient presents with a right painful bunion deformity.  Patient states painful to touch is progressive gotten worse she has tried shoe gear modification padding protecting offloading she would like to discuss treatment options for this.  She also has secondary complaint bilateral hallux thickened elongated dystrophic mycotic toenails.  She would like to discuss nail trim treatment options.  Review of Systems: Negative except as noted in the HPI. Denies N/V/F/Ch.  Past Medical History:  Diagnosis Date   Diverticulitis    Genital herpes    Pancreatitis     Current Outpatient Medications:    atorvastatin (LIPITOR) 20 MG tablet, Take 20 mg by mouth daily., Disp: , Rfl:    Biotin 5000 MCG CAPS, Take 5,000 capsules by mouth daily., Disp: , Rfl:    cetirizine (ZYRTEC) 10 MG tablet, Take 10 mg by mouth daily., Disp: , Rfl:    Cholecalciferol (VITAMIN D-3) 125 MCG (5000 UT) TABS, Take 125 mcg by mouth daily., Disp: , Rfl:    cyanocobalamin (VITAMIN B12) 1000 MCG tablet, Take 1,000 mcg by mouth daily., Disp: , Rfl:    dicyclomine (BENTYL) 20 MG tablet, Take 1 tablet (20 mg total) by mouth every 6 (six) hours as needed (for abdominal cramping)., Disp: 20 tablet, Rfl: 0   Diethylpropion HCl CR 75 MG TB24, Take by mouth., Disp: , Rfl:    FISH OIL-KRILL OIL PO, Take 2,000 mg by mouth., Disp: , Rfl:    HYDROcodone-acetaminophen (NORCO/VICODIN) 5-325 MG tablet, Take 1 tablet by mouth every 4 (four) hours as needed for moderate pain., Disp: 10 tablet, Rfl: 0   ibuprofen (ADVIL,MOTRIN) 800 MG  tablet, Take 1 tablet (800 mg total) by mouth 3 (three) times daily., Disp: 21 tablet, Rfl: 0   lidocaine (LIDODERM) 5 %, Place 1 patch onto the skin daily. Remove & Discard patch within 12 hours or as directed by MD, Disp: 30 patch, Rfl: 0   lisinopril (PRINIVIL,ZESTRIL) 10 MG tablet, Take 10 mg by mouth daily., Disp: , Rfl:    oxyCODONE-acetaminophen (PERCOCET/ROXICET) 5-325 MG tablet, Take 2 tablets by mouth every 4 (four) hours as needed for severe pain., Disp: 16 tablet, Rfl: 0   sertraline (ZOLOFT) 100 MG tablet, Take 200 mg by mouth daily., Disp: , Rfl:    sertraline (ZOLOFT) 100 MG tablet, Take 100 mg by mouth daily., Disp: , Rfl:    terbinafine (LAMISIL) 250 MG tablet, Take 1 tablet (250 mg total) by mouth daily., Disp: 90 tablet, Rfl: 0   topiramate (TOPAMAX) 50 MG tablet, Take 50 mg by mouth 2 (two) times daily., Disp: , Rfl:    traMADol (ULTRAM) 50 MG tablet, Take by mouth every 6 (six) hours as needed., Disp: , Rfl:    valACYclovir (VALTREX) 500 MG tablet, Take 500 mg by mouth 2 (two) times daily., Disp: , Rfl:   Social History   Tobacco Use  Smoking Status Never  Smokeless Tobacco Never    Allergies  Allergen Reactions   Ace Inhibitors    Latex  Nsaids     Gastric sleeve    Objective:  There were no vitals filed for this visit. There is no height or weight on file to calculate BMI. Constitutional Well developed. Well nourished.  Vascular Dorsalis pedis pulses palpable bilaterally. Posterior tibial pulses palpable bilaterally. Capillary refill normal to all digits.  No cyanosis or clubbing noted. Pedal hair growth normal.  Neurologic Normal speech. Oriented to person, place, and time. Epicritic sensation to light touch grossly present bilaterally.  Dermatologic Nails well groomed and normal in appearance. No open wounds. No skin lesions.  Orthopedic: Normal joint ROM without pain or crepitus bilaterally. Hallux abductovalgus deformity present.  This is a track  bound not a tracking deformity no intra-articular first MPJ pain noted Left 1st MPJ diminished range of motion. Left 1st TMT without gross hypermobility. Right 1st MPJ diminished range of motion  Right 1st TMT without gross hypermobility. Lesser digital contractures absent bilaterally.   Radiographs: Taken and reviewed. Hallux abductovalgus deformity present. Metatarsal parabola normal. 1st/2nd IMA: Moderate 14 degrees; TSP: 5 out of 7  Assessment:   1. Long-term use of high-risk medication   2. Hallux limitus of right foot   3. Encounter for preoperative examination for general surgical procedure    Plan:  Patient was evaluated and treated and all questions answered.  Hallux abductovalgus deformity, right -XR as above. -Patient has failed all conservative therapy and wishes to proceed with surgical intervention. All risks, benefits, and alternatives discussed with patient. No guarantees given. Consent reviewed and signed by patient. Post-op course explained at length. -Planned procedures: Right chevron osteotomy with possible phalangeal osteotomy -Risk factors: None -I explained my preoperative intra and postoperative plan with the patient extensive detail she states understanding like to proceed with listed above surgery. -Informed surgical risk consent was reviewed and read aloud to the patient.  I reviewed the films.  I have discussed my findings with the patient in great detail.  I have discussed all risks including but not limited to infection, stiffness, scarring, limp, disability, deformity, damage to blood vessels and nerves, numbness, poor healing, need for braces, arthritis, chronic pain, amputation, death.  All benefits and realistic expectations discussed in great detail.  I have made no promises as to the outcome.  I have provided realistic expectations.  I have offered the patient a 2nd opinion, which they have declined and assured me they preferred to proceed despite the  risks    Bilateral hallux onychomycosis -Educated the patient on the etiology of onychomycosis and various treatment options associated with improving the fungal load.  I explained to the patient that there is 3 treatment options available to treat the onychomycosis including topical, p.o., laser treatment.  Patient elected to undergo p.o. options with Lamisil/terbinafine therapy.  In order for me to start the medication therapy, I explained to the patient the importance of evaluating the liver and obtaining the liver function test.  Once the liver function test comes back normal I will start him on 46-month course of Lamisil therapy.  Patient understood all risk and would like to proceed with Lamisil therapy.  I have asked the patient to immediately stop the Lamisil therapy if she has any reactions to it and call the office or go to the emergency room right away.  Patient states understanding   No follow-ups on file.

## 2023-02-05 LAB — HEPATIC FUNCTION PANEL
ALT: 26 IU/L (ref 0–32)
AST: 25 IU/L (ref 0–40)
Albumin: 4.2 g/dL (ref 3.8–4.9)
Alkaline Phosphatase: 127 IU/L — ABNORMAL HIGH (ref 44–121)
Bilirubin Total: 0.4 mg/dL (ref 0.0–1.2)
Bilirubin, Direct: 0.1 mg/dL (ref 0.00–0.40)
Total Protein: 6.8 g/dL (ref 6.0–8.5)

## 2023-02-07 MED ORDER — TERBINAFINE HCL 250 MG PO TABS: 250.0000 mg | ORAL_TABLET | Freq: Every day | ORAL | 0 refills | Status: DC

## 2023-02-07 NOTE — Addendum Note (Signed)
Addended by: Nicholes Rough on: 02/07/2023 01:06 PM   Modules accepted: Orders

## 2023-10-09 ENCOUNTER — Emergency Department (HOSPITAL_BASED_OUTPATIENT_CLINIC_OR_DEPARTMENT_OTHER)
Admission: EM | Admit: 2023-10-09 | Discharge: 2023-10-09 | Disposition: A | Discharge status: Home / Self Care | Payer: MEDICAID | Attending: Emergency Medicine | Admitting: Emergency Medicine

## 2023-10-09 ENCOUNTER — Encounter (HOSPITAL_BASED_OUTPATIENT_CLINIC_OR_DEPARTMENT_OTHER): Payer: Self-pay

## 2023-10-09 ENCOUNTER — Other Ambulatory Visit: Payer: Self-pay

## 2023-10-09 ENCOUNTER — Emergency Department (HOSPITAL_BASED_OUTPATIENT_CLINIC_OR_DEPARTMENT_OTHER): Payer: MEDICAID

## 2023-10-09 DIAGNOSIS — Z79899 Other long term (current) drug therapy: Secondary | ICD-10-CM | POA: Insufficient documentation

## 2023-10-09 DIAGNOSIS — K449 Diaphragmatic hernia without obstruction or gangrene: Secondary | ICD-10-CM

## 2023-10-09 DIAGNOSIS — D3501 Benign neoplasm of right adrenal gland: Secondary | ICD-10-CM

## 2023-10-09 DIAGNOSIS — Z91040 Latex allergy status: Secondary | ICD-10-CM | POA: Diagnosis not present

## 2023-10-09 DIAGNOSIS — R1011 Right upper quadrant pain: Secondary | ICD-10-CM | POA: Insufficient documentation

## 2023-10-09 DIAGNOSIS — R110 Nausea: Secondary | ICD-10-CM | POA: Insufficient documentation

## 2023-10-09 DIAGNOSIS — I10 Essential (primary) hypertension: Secondary | ICD-10-CM | POA: Insufficient documentation

## 2023-10-09 DIAGNOSIS — R1010 Upper abdominal pain, unspecified: Secondary | ICD-10-CM

## 2023-10-09 LAB — COMPREHENSIVE METABOLIC PANEL WITH GFR
ALT: 31 U/L (ref 0–44)
AST: 43 U/L — ABNORMAL HIGH (ref 15–41)
Albumin: 3.7 g/dL (ref 3.5–5.0)
Alkaline Phosphatase: 101 U/L (ref 38–126)
Anion gap: 9 (ref 5–15)
BUN: 16 mg/dL (ref 6–20)
CO2: 28 mmol/L (ref 22–32)
Calcium: 9.4 mg/dL (ref 8.9–10.3)
Chloride: 103 mmol/L (ref 98–111)
Creatinine, Ser: 0.83 mg/dL (ref 0.44–1.00)
GFR, Estimated: >60 mL/min (ref >60)
Glucose, Bld: 87 mg/dL (ref 70–99)
Potassium: 3.7 mmol/L (ref 3.5–5.1)
Sodium: 140 mmol/L (ref 135–145)
Total Bilirubin: 0.7 mg/dL (ref 0.0–1.2)
Total Protein: 7.3 g/dL (ref 6.5–8.1)

## 2023-10-09 LAB — PREGNANCY, URINE: Preg Test, Ur: NEGATIVE

## 2023-10-09 LAB — CBC
HCT: 42.2 % (ref 36.0–46.0)
Hemoglobin: 13.7 g/dL (ref 12.0–15.0)
MCH: 27.8 pg (ref 26.0–34.0)
MCHC: 32.5 g/dL (ref 30.0–36.0)
MCV: 85.6 fL (ref 80.0–100.0)
Platelets: 305 10*3/uL (ref 150–400)
RBC: 4.93 MIL/uL (ref 3.87–5.11)
RDW: 15.4 % (ref 11.5–15.5)
WBC: 5.9 10*3/uL (ref 4.0–10.5)
nRBC: 0 % (ref 0.0–0.2)

## 2023-10-09 LAB — URINALYSIS, MICROSCOPIC (REFLEX)

## 2023-10-09 LAB — URINALYSIS, ROUTINE W REFLEX MICROSCOPIC
Bilirubin Urine: NEGATIVE
Glucose, UA: NEGATIVE mg/dL
Ketones, ur: NEGATIVE mg/dL
Leukocytes,Ua: NEGATIVE
Nitrite: NEGATIVE
Protein, ur: NEGATIVE mg/dL
Specific Gravity, Urine: >=1.03 (ref 1.005–1.030)
pH: 5.5 (ref 5.0–8.0)

## 2023-10-09 LAB — LIPASE, BLOOD: Lipase: 66 U/L — ABNORMAL HIGH (ref 11–51)

## 2023-10-09 MED ORDER — DICYCLOMINE HCL 20 MG PO TABS: 20.0000 mg | ORAL_TABLET | Freq: Two times a day (BID) | ORAL | 0 refills | Status: DC | PRN

## 2023-10-09 MED ORDER — IOHEXOL 300 MG/ML  SOLN
100.0000 mL | Freq: Once | INTRAMUSCULAR | Status: AC | PRN
  Administered 2023-10-09: 100 mL via INTRAVENOUS

## 2023-10-09 MED ORDER — ONDANSETRON HCL 4 MG/2ML IJ SOLN: 4.0000 mg | Freq: Once | INTRAMUSCULAR | Status: DC

## 2023-10-09 MED ORDER — ONDANSETRON 4 MG PO TBDP
8.0000 mg | ORAL_TABLET | Freq: Once | ORAL | Status: AC
  Administered 2023-10-09: 8 mg via ORAL
  Filled 2023-10-09: qty 2

## 2023-10-09 MED ORDER — DICYCLOMINE HCL 10 MG PO CAPS
10.0000 mg | ORAL_CAPSULE | Freq: Once | ORAL | Status: AC
  Administered 2023-10-09: 10 mg via ORAL
  Filled 2023-10-09: qty 1

## 2023-10-09 MED ORDER — ONDANSETRON 4 MG PO TBDP: 4.0000 mg | ORAL_TABLET | Freq: Three times a day (TID) | ORAL | 0 refills | Status: DC | PRN

## 2023-10-09 NOTE — ED Triage Notes (Signed)
 Generalized abd pain after eating around 1400 today. Nausea, diarrhea. Denies emesis or urinary issues.   Reports hx of diverticulosis and chronic pancreatitis

## 2023-10-09 NOTE — Discharge Instructions (Addendum)
 As discussed, your CT scan appeared normal.  Your labs are also reassuring.  Will send you with Bentyl  to take as needed for your abdominal discomfort as well as nausea medicine.  Please do not hesitate to return if the worrisome signs and symptoms we discussed become apparent.

## 2023-10-09 NOTE — ED Provider Notes (Signed)
 [facility] Provider Note   CSN: [id number] Arrival date & time: 10/09/23  1432     History  Chief Complaint  Patient presents with   Abdominal Pain    Carmen Rhodes is a 53 y.o. female.   Abdominal Pain   53 year old female presents emergency department with complaints of abdominal pain, nausea.  States that symptoms began after consuming lunch.  States Carmen Rhodes is a history of similar symptoms that been intermittent over the past 6 months.  States that they typically go away whenever Carmen Rhodes burps, passes gas or has a bowel movement.  Had 2 bowel movements after the incident today.  States that symptoms seem to be improving but still with upper abdominal pain.  States that it does radiate some to her back.  Reports prior abdominal surgeries including gastric sleeve, cholecystectomy, tubal ligation, pain and colectomy, umbilical hernia repair.  Denies any fevers, chills, emesis, urinary symptoms, change in bowel habits.  Past medical history significant for diverticulitis, pancreatitis, hypertension, GERD, obesity, Degenerative disc disease, MDD, multiple personality disorder, nonalcoholic fatty liver, PTSD, OSA  Home Medications Prior to Admission medications   Medication Sig Start Date End Date Taking? Authorizing Provider  dicyclomine  (BENTYL ) 20 MG tablet Take 1 tablet (20 mg total) by mouth 2 (two) times daily as needed. 10/09/23  Yes Neil Balls A, PA  ondansetron  (ZOFRAN -ODT) 4 MG disintegrating tablet Take 1 tablet (4 mg total) by mouth every 8 (eight) hours as needed. 10/09/23  Yes Neil Balls A, PA  atorvastatin (LIPITOR) 20 MG tablet Take 20 mg by mouth daily.    [provider]  Biotin 5000 MCG CAPS Take 5,000 capsules by mouth daily.    [provider]  cetirizine (ZYRTEC) 10 MG tablet Take 10 mg by mouth daily.    [provider]  Cholecalciferol (VITAMIN D-3) 125 MCG (5000 UT) TABS Take 125 mcg by  mouth daily.    [provider]  cyanocobalamin (VITAMIN B12) 1000 MCG tablet Take 1,000 mcg by mouth daily.    [provider]  Diethylpropion HCl CR 75 MG TB24 Take by mouth.    [provider]  FISH OIL-KRILL OIL PO Take 2,000 mg by mouth.    [provider]  HYDROcodone -acetaminophen  (NORCO/VICODIN) 5-325 MG tablet Take 1 tablet by mouth every 4 (four) hours as needed for moderate pain. 12/14/18   Ballard Bongo, MD  ibuprofen  (ADVIL ,MOTRIN ) 800 MG tablet Take 1 tablet (800 mg total) by mouth 3 (three) times daily. 09/30/13   Terrilyn Fick, MD  lidocaine  (LIDODERM ) 5 % Place 1 patch onto the skin daily. Remove & Discard patch within 12 hours or as directed by MD 12/14/18   Ballard Bongo, MD  lisinopril (PRINIVIL,ZESTRIL) 10 MG tablet Take 10 mg by mouth daily.    [provider]  oxyCODONE -acetaminophen  (PERCOCET/ROXICET) 5-325 MG tablet Take 2 tablets by mouth every 4 (four) hours as needed for severe pain. 09/05/17   Priscella Brooms, PA-C  sertraline (ZOLOFT) 100 MG tablet Take 200 mg by mouth daily.    [provider]  sertraline (ZOLOFT) 100 MG tablet Take 100 mg by mouth daily.    [provider]  terbinafine  (LAMISIL ) 250 MG tablet Take 1 tablet (250 mg total) by mouth daily. 02/07/23   Velma Ghazi, DPM  topiramate (TOPAMAX) 50 MG tablet Take 50 mg by mouth 2 (two) times daily.    [provider]  traMADol Marionette Sick)  50 MG tablet Take by mouth every 6 (six) hours as needed.    [provider]  valACYclovir (VALTREX) 500 MG tablet Take 500 mg by mouth 2 (two) times daily.    [provider]      Allergies    Ace inhibitors, Latex, and Nsaids    Review of Systems   Review of Systems  Gastrointestinal:  Positive for abdominal pain.  All other systems reviewed and are negative.   Physical Exam Updated Vital Signs BP (!) 132/103   Pulse 91   Temp 98.1 F (36.7 C) (Oral)    Resp 20   Ht 5\' 5"  (1.651 m)   Wt 113.4 kg   SpO2 98%   BMI 41.60 kg/m  Physical Exam Vitals and nursing note reviewed.  Constitutional:      General: Carmen Rhodes is not in acute distress.    Appearance: Carmen Rhodes is well-developed.  HENT:     Head: Normocephalic and atraumatic.  Eyes:     Conjunctiva/sclera: Conjunctivae normal.  Cardiovascular:     Rate and Rhythm: Normal rate and regular rhythm.     Heart sounds: No murmur heard. Pulmonary:     Effort: Pulmonary effort is normal. No respiratory distress.     Breath sounds: Normal breath sounds.  Abdominal:     Palpations: Abdomen is soft.     Tenderness: There is abdominal tenderness in the right upper quadrant, epigastric area and left upper quadrant.  Musculoskeletal:        General: No swelling.     Cervical back: Neck supple.  Skin:    General: Skin is warm and dry.     Capillary Refill: Capillary refill takes less than 2 seconds.  Neurological:     Mental Status: Carmen Rhodes is alert.  Psychiatric:        Mood and Affect: Mood normal.     ED Results / Procedures / Treatments   Labs (all labs ordered are listed, but only abnormal results are displayed) Labs Reviewed  LIPASE, BLOOD - Abnormal; Notable for the following components:      Result Value   Lipase 66 (*)    All other components within normal limits  COMPREHENSIVE METABOLIC PANEL WITH GFR - Abnormal; Notable for the following components:   AST 43 (*)    All other components within normal limits  URINALYSIS, ROUTINE W REFLEX MICROSCOPIC - Abnormal; Notable for the following components:   APPearance HAZY (*)    Hgb urine dipstick TRACE (*)    All other components within normal limits  URINALYSIS, MICROSCOPIC (REFLEX) - Abnormal; Notable for the following components:   Bacteria, UA FEW (*)    All other components within normal limits  CBC  PREGNANCY, URINE    EKG None  Radiology CT ABDOMEN PELVIS W CONTRAST Result Date: 10/09/2023 CLINICAL DATA:  Abdominal pain,  acute, nonlocalized. Nausea and diarrhea. EXAM: CT ABDOMEN AND PELVIS WITH CONTRAST TECHNIQUE: Multidetector CT imaging of the abdomen and pelvis was performed using the standard protocol following bolus administration of intravenous contrast. RADIATION DOSE REDUCTION: This exam was performed according to the departmental dose-optimization program which includes automated exposure control, adjustment of the mA and/or kV according to patient size and/or use of iterative reconstruction technique. CONTRAST:  OMNIPAQUE  IOHEXOL  300 MG/ML  SOLN COMPARISON:  12/12/2022. FINDINGS: Lower chest: No acute abnormality. Hepatobiliary: No focal liver abnormality is seen. Status post cholecystectomy. No biliary dilatation. Pancreas: Unremarkable. No pancreatic ductal dilatation or surrounding inflammatory changes. Spleen: Normal  in size without focal abnormality. Adrenals/Urinary Tract: The left adrenal gland is within normal limits. There is a 1.3 cm hypodense nodule in the right adrenal gland with attenuation of 7 Hounsfield units on delayed imaging. The left adrenal gland is within normal limits. The kidneys enhance symmetrically. No renal calculus or obstructive uropathy bilaterally. The bladder is unremarkable. Stomach/Bowel: There is a small hiatal hernia. Gastric surgical changes are noted. No bowel obstruction, free air, or pneumatosis is seen. A few scattered diverticula are present along the colon without evidence of diverticulitis. No focal bowel wall thickening or surrounding inflammatory changes are seen. The appendix appears normal. Vascular/Lymphatic: No significant vascular findings are present. No enlarged abdominal or pelvic lymph nodes. Reproductive: Uterus and bilateral adnexa are unremarkable. Other: No abdominopelvic ascites. Musculoskeletal: Degenerative changes are present in the thoracolumbar spine. No acute osseous abnormality. IMPRESSION: 1. No acute intra-abdominal process. 2. Diverticulosis  without diverticulitis. 3. Small hiatal hernia. 4. Right adrenal adenoma. Electronically Signed   By: Wyvonnia Heimlich M.D.   On: 10/09/2023 17:43    Procedures Procedures    Medications Ordered in ED Medications  dicyclomine  (BENTYL ) capsule 10 mg (10 mg Oral Given 10/09/23 1601)  ondansetron  (ZOFRAN -ODT) disintegrating tablet 8 mg (8 mg Oral Given 10/09/23 1600)  iohexol  (OMNIPAQUE ) 300 MG/ML solution 100 mL (100 mLs Intravenous Contrast Given 10/09/23 1704)    ED Course/ Medical Decision Making/ A&P                                 Medical Decision Making Amount and/or Complexity of Data Reviewed Labs: ordered. Radiology: ordered.  Risk Prescription drug management.   This patient presents to the ED for concern of abdominal pain, this involves an extensive number of treatment options, and is a complaint that carries with it a high risk of complications and morbidity.  The differential diagnosis includes gastritis, PUD, cholecystitis, CBD pathology gastritis, SBO/LBO, volvulus, diverticulitis, appendicitis, foodborne illness, gastroenteritis other   Co morbidities that complicate the patient evaluation  See HPI   Additional history obtained:  Additional history obtained from EMR External records from outside source obtained and reviewed including hospital records   Lab Tests:  I Ordered, and personally interpreted labs.  The pertinent results include: No leukocytosis.  No evidence of anemia.  Platelets within range.  No Electra abnormalities.  Slight elevation of AST of 43.  No renal dysfunction.  UA with few bacteria, trace hemoglobin otherwise unremarkable.  Lipase 6 6.  Pregnancy negative.   Imaging Studies ordered:  I ordered imaging studies including CT abdomen pelvis I independently visualized and interpreted imaging which showed no acute abnormality.  Diverticulosis.  Right adrenal adenoma.  Hiatal hernia. I agree with the radiologist interpretation   Cardiac  Monitoring: / EKG:  The patient was maintained on a cardiac monitor.  I personally viewed and interpreted the cardiac monitored which showed an underlying rhythm of: Sinus rhythm   Consultations Obtained:  N/a   Problem List / ED Course / Critical interventions / Medication management  Upper abdominal pain I ordered medication including Bentyl , Zofran    Reevaluation of the patient after these medicines showed that the patient improved I have reviewed the patients home medicines and have made adjustments as needed   Social Determinants of Health:  Denies tobacco, illicit drug use.   Test / Admission - Considered:  Upper abdominal pain Vitals signs within normal range and stable throughout visit. Laboratory/imaging  studies significant for: See above 53 year old female presents emergency department with complaints of abdominal pain, nausea.  States that symptoms began after consuming lunch.  States Carmen Rhodes is a history of similar symptoms that been intermittent over the past 6 months.  States that they typically go away whenever Carmen Rhodes burps, passes gas or has a bowel movement.  Had 2 bowel movements after the incident today.  States that symptoms seem to be improving but still with upper abdominal pain.  States that it does radiate some to her back.  Reports prior abdominal surgeries including gastric sleeve, cholecystectomy, tubal ligation, pain and colectomy, umbilical hernia repair.  Denies any fevers, chills, emesis, urinary symptoms, change in bowel habits. On exam, diffuse upper abdominal tenderness without guarding or rebound.  Labs without evidence of acute emergent process.  CT imaging negative.  Unsure of exact etiology of patient's symptoms but could be secondary to GERD versus gastritis versus delayed gastric emptying versus other.  Symptoms do not seem other emergent process.  Will recommend eating slowly with smaller meals.  Follow-up with bariatric surgery recommended for  reevaluation.  Treatment plan discussed with the patient and he acknowledged understanding was agreeable to said plan.  Patient over well-appearing, afebrile in no acute distress. Worrisome signs and symptoms were discussed with the patient, and the patient acknowledged understanding to return to the ED if noticed. Patient was stable upon discharge.          Final Clinical Impression(s) / ED Diagnoses Final diagnoses:  Pain of upper abdomen    Rx / DC Orders ED Discharge Orders          Ordered    ondansetron  (ZOFRAN -ODT) 4 MG disintegrating tablet  Every 8 hours PRN        10/09/23 1748    dicyclomine  (BENTYL ) 20 MG tablet  2 times daily PRN        10/09/23 1748              Loretto Butter, PA 10/09/23 1847    Albertus Hughs, DO 10/09/23 1943

## 2023-11-10 NOTE — H&P (Signed)
 Subjective Patient ID: Carmen Rhodes is a 53 y.o. female.     HPI   Returns for follow up discussion prior to planned breast reduction. Current  48/52 DD. Reports several year history neck back and shoulder pain. Endorses associated HA and numbness hands. Denies rashes beneath breasts but reports ulcerations shoulders from bra straps recurrent.  She has tried PT course, specialty fitted bras, OTC pain medication, hot/cold packs, and weight lass as below for over year trial without relief.   Highest weight 385 lb. Underwent sleeve gastrectomy 2017. Lowest weight following this 199 lb. Followed by [facility]. Since last visit, Wegovy discontinued and started Zepbound. Wt unchanged since consult visit.   Screening [facility] 01/2023 benign. PA diagnosed with breast ca in her 21s, passed from this.    Patient has undergone FDL by Dr. Amanda Jungling 2022. She also had consultation for breast reduction, was not covered by her insurance. PMH significant for resolved OSA, HTN- she reports resolved with weight loss but started hydrochlorothiazide again due to headaches, multiple personality disorder. Latter she reports has not affected her surgery recovery in past, reports her psychiatrist has given her name of new therapist for this.    Works as Psychologist, forensic at [employer]. Lives with spouse and kids ages 54 and 53. Also in school for bachelors in education.   Review of Systems  Eyes:  Positive for visual disturbance.  Musculoskeletal:  Positive for arthralgias, back pain and neck pain.  Skin:  Positive for rash.  Neurological:  Positive for headaches.  Psychiatric/Behavioral:  Positive for sleep disturbance. The patient is nervous/anxious.   All other systems reviewed and are negative.     Objective Physical Exam  Cardiovascular: Normal rate, regular rhythm and normal heart sounds.    Pulmonary/Chest Effort normal and breath sounds normal.    Skin   Fitzpatrick 5      Lymph:  no palpable axillary adenopathy   +shoulder grooving Breasts: no palpable masses, grade 3 ptosis bilateral SN to nipple R 39 L 41 cm BW R 31 L 31 cm Nipple to IMF R 12 L 13 cm   Assessment/Plan Macromastia Chronic neck and back pain Intertrigo Morbid obesity with BMI of 40.0-44.9, adult (CMD)   Recommend patient be stable at goal weight prior to surgery. Reviewed significant weigh loss post reduction surgery will lead to early recurrent ptosis and possible volume breast less than desired. Hold Zepbound week prior to surgery.    Chronic neck and back pain in setting of macromastia that has failed conservative management over 12 month trial. The pain is not related to other diagnoses. The pain and rashes interfere with activities of daily living. There is a reasonable likelihood that the patient's symptoms are primarily due to macromastia. Breast reduction is likely to result in improvement of the chronic pain.  Reviewed reduction with anchor type scars, OP surgery, drains, post operative visits and limitations, recovery. Diminished sensation nipple and breast skin, risk of nipple loss, wound healing problems, asymmetry, incidental carcinoma, changes with wt gain/loss, aging, unacceptable cosmetic appearance reviewed. Reviewed scar maturation over months.  Reviewed cannot assure cup size. We reviewed her SN to nipple distance as well as overall breast size place her at greater risk NAC compromise. Discussed possibility conversion to free nipple grafts intraoperatively if concern.    Additional risks including but not limited to bleeding, seroma, hematoma, damage to adjacent structures, infection, asymmetry, damage to adjacent structures, need for additional procedures, unacceptable cosmetic result, blood  clots in legs or lungs reviewed. Completed ASPS consent for breast reduction.   Drain teaching completed. Rx for tramadol given.   Anticipate at least 1000 g reduction from each  breast.  Alger Infield, MD [facility]  Office/ physician access line after hours [phone]

## 2023-11-11 ENCOUNTER — Encounter (HOSPITAL_BASED_OUTPATIENT_CLINIC_OR_DEPARTMENT_OTHER): Payer: Self-pay | Admitting: Plastic Surgery

## 2023-11-11 ENCOUNTER — Other Ambulatory Visit: Payer: Self-pay

## 2023-11-16 MED ORDER — CHLORHEXIDINE GLUCONATE CLOTH 2 % EX PADS: 6.0000 | MEDICATED_PAD | Freq: Once | CUTANEOUS | Status: DC

## 2023-11-16 NOTE — Progress Notes (Signed)

## 2023-11-22 ENCOUNTER — Observation Stay (HOSPITAL_BASED_OUTPATIENT_CLINIC_OR_DEPARTMENT_OTHER)
Admission: RE | Admit: 2023-11-22 | Discharge: 2023-11-23 | Disposition: A | Discharge status: Home / Self Care | Payer: MEDICAID | Attending: Plastic Surgery | Admitting: Plastic Surgery

## 2023-11-22 ENCOUNTER — Encounter (HOSPITAL_BASED_OUTPATIENT_CLINIC_OR_DEPARTMENT_OTHER)
Admission: RE | Disposition: A | Discharge status: Home / Self Care | Payer: Self-pay | Source: Home / Self Care | Attending: Plastic Surgery

## 2023-11-22 ENCOUNTER — Other Ambulatory Visit: Payer: Self-pay

## 2023-11-22 ENCOUNTER — Ambulatory Visit (HOSPITAL_BASED_OUTPATIENT_CLINIC_OR_DEPARTMENT_OTHER): Payer: MEDICAID | Admitting: Anesthesiology

## 2023-11-22 ENCOUNTER — Encounter (HOSPITAL_BASED_OUTPATIENT_CLINIC_OR_DEPARTMENT_OTHER): Payer: Self-pay | Admitting: Plastic Surgery

## 2023-11-22 DIAGNOSIS — N62 Hypertrophy of breast: Secondary | ICD-10-CM | POA: Diagnosis not present

## 2023-11-22 DIAGNOSIS — Z6841 Body Mass Index (BMI) 40.0 and over, adult: Secondary | ICD-10-CM | POA: Diagnosis not present

## 2023-11-22 DIAGNOSIS — L304 Erythema intertrigo: Secondary | ICD-10-CM | POA: Insufficient documentation

## 2023-11-22 HISTORY — PX: BREAST REDUCTION SURGERY: SHX8

## 2023-11-22 HISTORY — DX: Post-traumatic stress disorder, unspecified: F43.10

## 2023-11-22 HISTORY — DX: Other complications of anesthesia, initial encounter: T88.59XA

## 2023-11-22 HISTORY — DX: Major depressive disorder, single episode, unspecified: F32.9

## 2023-11-22 HISTORY — DX: Essential (primary) hypertension: I10

## 2023-11-22 HISTORY — DX: Obstructive sleep apnea (adult) (pediatric): G47.33

## 2023-11-22 HISTORY — DX: Fatty (change of) liver, not elsewhere classified: K76.0

## 2023-11-22 HISTORY — DX: Gastro-esophageal reflux disease without esophagitis: K21.9

## 2023-11-22 LAB — POCT PREGNANCY, URINE: Preg Test, Ur: NEGATIVE

## 2023-11-22 SURGERY — MAMMOPLASTY, REDUCTION
Anesthesia: General | Site: Breast | Laterality: Bilateral

## 2023-11-22 MED ORDER — PHENYLEPHRINE HCL (PRESSORS) 10 MG/ML IV SOLN
INTRAVENOUS | Status: AC
  Filled 2023-11-22: qty 1

## 2023-11-22 MED ORDER — MIDAZOLAM HCL 2 MG/2ML IJ SOLN
INTRAMUSCULAR | Status: AC
Start: 2023-11-22 — End: ?
  Filled 2023-11-22: qty 2

## 2023-11-22 MED ORDER — HYDROMORPHONE HCL 1 MG/ML IJ SOLN: 0.5000 mg | INTRAMUSCULAR | Status: DC | PRN

## 2023-11-22 MED ORDER — DEXAMETHASONE SODIUM PHOSPHATE 4 MG/ML IJ SOLN
INTRAMUSCULAR | Status: DC | PRN
  Administered 2023-11-22: 5 mg via INTRAVENOUS

## 2023-11-22 MED ORDER — SERTRALINE HCL 100 MG PO TABS
100.0000 mg | ORAL_TABLET | Freq: Every day | ORAL | Status: DC
  Administered 2023-11-22: 100 mg via ORAL

## 2023-11-22 MED ORDER — LACTATED RINGERS IV SOLN: INTRAVENOUS | Status: DC

## 2023-11-22 MED ORDER — CEFAZOLIN SODIUM-DEXTROSE 2-4 GM/100ML-% IV SOLN
2.0000 g | INTRAVENOUS | Status: AC
  Administered 2023-11-22: 2 g via INTRAVENOUS

## 2023-11-22 MED ORDER — SUGAMMADEX SODIUM 200 MG/2ML IV SOLN
INTRAVENOUS | Status: DC | PRN
  Administered 2023-11-22: 224.6 mg via INTRAVENOUS

## 2023-11-22 MED ORDER — ACETAMINOPHEN 500 MG PO TABS
1000.0000 mg | ORAL_TABLET | ORAL | Status: AC
  Administered 2023-11-22: 1000 mg via ORAL

## 2023-11-22 MED ORDER — HYDROMORPHONE HCL 1 MG/ML IJ SOLN
INTRAMUSCULAR | Status: AC
Start: 2023-11-22 — End: ?
  Filled 2023-11-22: qty 0.5

## 2023-11-22 MED ORDER — BUPIVACAINE-EPINEPHRINE (PF) 0.25% -1:200000 IJ SOLN
INTRAMUSCULAR | Status: AC
  Filled 2023-11-22: qty 30

## 2023-11-22 MED ORDER — FENTANYL CITRATE (PF) 100 MCG/2ML IJ SOLN
INTRAMUSCULAR | Status: DC | PRN
  Administered 2023-11-22 (×4): 50 ug via INTRAVENOUS

## 2023-11-22 MED ORDER — ONDANSETRON HCL 4 MG/2ML IJ SOLN
INTRAMUSCULAR | Status: DC | PRN
  Administered 2023-11-22: 4 mg via INTRAVENOUS

## 2023-11-22 MED ORDER — TRAMADOL HCL 50 MG PO TABS
50.0000 mg | ORAL_TABLET | Freq: Four times a day (QID) | ORAL | Status: DC | PRN
  Administered 2023-11-22 – 2023-11-23 (×2): 50 mg via ORAL
  Filled 2023-11-22 (×2): qty 1

## 2023-11-22 MED ORDER — ROCURONIUM BROMIDE 100 MG/10ML IV SOLN
INTRAVENOUS | Status: DC | PRN
  Administered 2023-11-22: 80 mg via INTRAVENOUS

## 2023-11-22 MED ORDER — HYDROMORPHONE HCL 1 MG/ML IJ SOLN
INTRAMUSCULAR | Status: AC
  Filled 2023-11-22: qty 0.5

## 2023-11-22 MED ORDER — CEFAZOLIN SODIUM-DEXTROSE 2-4 GM/100ML-% IV SOLN
INTRAVENOUS | Status: AC
  Filled 2023-11-22: qty 100

## 2023-11-22 MED ORDER — ENOXAPARIN SODIUM 40 MG/0.4ML IJ SOSY
40.0000 mg | PREFILLED_SYRINGE | INTRAMUSCULAR | Status: DC
  Administered 2023-11-23: 40 mg via SUBCUTANEOUS
  Filled 2023-11-22: qty 0.4

## 2023-11-22 MED ORDER — PROPOFOL 10 MG/ML IV BOLUS
INTRAVENOUS | Status: AC
  Filled 2023-11-22: qty 20

## 2023-11-22 MED ORDER — MIDAZOLAM HCL 5 MG/5ML IJ SOLN
INTRAMUSCULAR | Status: DC | PRN
  Administered 2023-11-22: 2 mg via INTRAVENOUS

## 2023-11-22 MED ORDER — ACETAMINOPHEN 500 MG PO TABS
ORAL_TABLET | ORAL | Status: AC
  Filled 2023-11-22: qty 2

## 2023-11-22 MED ORDER — PHENYLEPHRINE HCL-NACL 20-0.9 MG/250ML-% IV SOLN
INTRAVENOUS | Status: DC | PRN
Start: 2023-11-22 — End: 2023-11-22
  Administered 2023-11-22: 40 ug/min via INTRAVENOUS

## 2023-11-22 MED ORDER — MEPERIDINE HCL 25 MG/ML IJ SOLN: 6.2500 mg | INTRAMUSCULAR | Status: DC | PRN

## 2023-11-22 MED ORDER — ACETAMINOPHEN 10 MG/ML IV SOLN
1000.0000 mg | Freq: Once | INTRAVENOUS | Status: AC
  Administered 2023-11-22: 1000 mg via INTRAVENOUS

## 2023-11-22 MED ORDER — EPHEDRINE 5 MG/ML INJ
INTRAVENOUS | Status: AC
  Filled 2023-11-22: qty 5

## 2023-11-22 MED ORDER — FENTANYL CITRATE (PF) 100 MCG/2ML IJ SOLN
INTRAMUSCULAR | Status: AC
  Filled 2023-11-22: qty 2

## 2023-11-22 MED ORDER — BUPIVACAINE HCL (PF) 0.5 % IJ SOLN
INTRAMUSCULAR | Status: DC | PRN
  Administered 2023-11-22: 30 mL

## 2023-11-22 MED ORDER — OXYCODONE HCL 5 MG PO TABS: 5.0000 mg | ORAL_TABLET | ORAL | Status: DC | PRN

## 2023-11-22 MED ORDER — ACETAMINOPHEN 10 MG/ML IV SOLN
INTRAVENOUS | Status: AC
  Filled 2023-11-22: qty 100

## 2023-11-22 MED ORDER — HYDROMORPHONE HCL 1 MG/ML IJ SOLN
0.2500 mg | INTRAMUSCULAR | Status: DC | PRN
  Administered 2023-11-22 (×3): 0.5 mg via INTRAVENOUS

## 2023-11-22 MED ORDER — ONDANSETRON 4 MG PO TBDP: 4.0000 mg | ORAL_TABLET | Freq: Four times a day (QID) | ORAL | Status: DC | PRN

## 2023-11-22 MED ORDER — ATORVASTATIN CALCIUM 20 MG PO TABS
20.0000 mg | ORAL_TABLET | Freq: Every day | ORAL | Status: DC
  Administered 2023-11-22: 20 mg via ORAL

## 2023-11-22 MED ORDER — ALBUMIN HUMAN 5 % IV SOLN: INTRAVENOUS | Status: DC | PRN

## 2023-11-22 MED ORDER — OXYCODONE HCL 5 MG PO TABS: 5.0000 mg | ORAL_TABLET | Freq: Once | ORAL | Status: DC | PRN

## 2023-11-22 MED ORDER — LIDOCAINE HCL (CARDIAC) PF 100 MG/5ML IV SOSY
PREFILLED_SYRINGE | INTRAVENOUS | Status: DC | PRN
  Administered 2023-11-22: 100 mg via INTRAVENOUS

## 2023-11-22 MED ORDER — 0.9 % SODIUM CHLORIDE (POUR BTL) OPTIME
TOPICAL | Status: DC | PRN
  Administered 2023-11-22: 200 mL

## 2023-11-22 MED ORDER — SERTRALINE HCL 100 MG PO TABS
200.0000 mg | ORAL_TABLET | Freq: Every day | ORAL | Status: DC
  Administered 2023-11-22: 200 mg via ORAL

## 2023-11-22 MED ORDER — OXYCODONE HCL 5 MG/5ML PO SOLN: 5.0000 mg | Freq: Once | ORAL | Status: DC | PRN

## 2023-11-22 MED ORDER — ACETAMINOPHEN 325 MG RE SUPP: 650.0000 mg | Freq: Four times a day (QID) | RECTAL | Status: DC | PRN

## 2023-11-22 MED ORDER — GABAPENTIN 300 MG PO CAPS
ORAL_CAPSULE | ORAL | Status: AC
  Filled 2023-11-22: qty 1

## 2023-11-22 MED ORDER — ONDANSETRON HCL 4 MG/2ML IJ SOLN: 4.0000 mg | Freq: Four times a day (QID) | INTRAMUSCULAR | Status: DC | PRN

## 2023-11-22 MED ORDER — GABAPENTIN 300 MG PO CAPS
300.0000 mg | ORAL_CAPSULE | ORAL | Status: AC
  Administered 2023-11-22: 300 mg via ORAL

## 2023-11-22 MED ORDER — LISINOPRIL 10 MG PO TABS
10.0000 mg | ORAL_TABLET | Freq: Every day | ORAL | Status: DC
  Administered 2023-11-22: 10 mg via ORAL

## 2023-11-22 MED ORDER — ALBUMIN HUMAN 5 % IV SOLN
INTRAVENOUS | Status: AC
  Filled 2023-11-22: qty 250

## 2023-11-22 MED ORDER — AMISULPRIDE (ANTIEMETIC) 5 MG/2ML IV SOLN: 10.0000 mg | Freq: Once | INTRAVENOUS | Status: DC | PRN

## 2023-11-22 MED ORDER — KCL IN DEXTROSE-NACL 20-5-0.45 MEQ/L-%-% IV SOLN
INTRAVENOUS | Status: DC
Start: 2023-11-22 — End: 2023-11-23

## 2023-11-22 MED ORDER — ACETAMINOPHEN 325 MG PO TABS
650.0000 mg | ORAL_TABLET | Freq: Four times a day (QID) | ORAL | Status: DC | PRN
  Administered 2023-11-22 – 2023-11-23 (×2): 650 mg via ORAL
  Filled 2023-11-22 (×2): qty 2

## 2023-11-22 MED ORDER — PROPOFOL 10 MG/ML IV BOLUS
INTRAVENOUS | Status: DC | PRN
  Administered 2023-11-22: 40 mg via INTRAVENOUS
  Administered 2023-11-22: 160 mg via INTRAVENOUS

## 2023-11-22 MED ORDER — EPHEDRINE SULFATE (PRESSORS) 50 MG/ML IJ SOLN
INTRAMUSCULAR | Status: DC | PRN
  Administered 2023-11-22: 5 mg via INTRAVENOUS

## 2023-11-22 SURGICAL SUPPLY — 46 items
BINDER BREAST 3XL (GAUZE/BANDAGES/DRESSINGS) IMPLANT
BINDER BREAST LRG (GAUZE/BANDAGES/DRESSINGS) IMPLANT
BINDER BREAST MEDIUM (GAUZE/BANDAGES/DRESSINGS) IMPLANT
BINDER BREAST XLRG (GAUZE/BANDAGES/DRESSINGS) IMPLANT
BINDER BREAST XXLRG (GAUZE/BANDAGES/DRESSINGS) IMPLANT
BLADE SURG 10 STRL SS (BLADE) ×4 IMPLANT
BNDG GAUZE DERMACEA FLUFF 4 (GAUZE/BANDAGES/DRESSINGS) ×2 IMPLANT
CANISTER SUCT 1200ML W/VALVE (MISCELLANEOUS) ×1 IMPLANT
CHLORAPREP W/TINT 26 (MISCELLANEOUS) ×2 IMPLANT
COVER BACK TABLE 60X90IN (DRAPES) ×1 IMPLANT
COVER MAYO STAND STRL (DRAPES) ×1 IMPLANT
DERMABOND ADVANCED .7 DNX12 (GAUZE/BANDAGES/DRESSINGS) ×2 IMPLANT
DRAIN CHANNEL 15F RND FF W/TCR (WOUND CARE) IMPLANT
DRAIN CHANNEL 19F RND (DRAIN) IMPLANT
DRAPE TOP ARMCOVERS (MISCELLANEOUS) ×1 IMPLANT
DRAPE U-SHAPE 76X120 STRL (DRAPES) ×1 IMPLANT
DRAPE UTILITY XL STRL (DRAPES) ×1 IMPLANT
ELECT COATED BLADE 2.86 ST (ELECTRODE) ×1 IMPLANT
ELECTRODE REM PT RTRN 9FT ADLT (ELECTROSURGICAL) ×1 IMPLANT
EVACUATOR SILICONE 100CC (DRAIN) IMPLANT
GAUZE PAD ABD 8X10 STRL (GAUZE/BANDAGES/DRESSINGS) ×2 IMPLANT
GLOVE BIO SURGEON STRL SZ 6 (GLOVE) ×2 IMPLANT
GOWN STRL REUS W/ TWL LRG LVL3 (GOWN DISPOSABLE) ×2 IMPLANT
MARKER SKIN DUAL TIP RULER LAB (MISCELLANEOUS) IMPLANT
NDL HYPO 25X1 1.5 SAFETY (NEEDLE) ×1 IMPLANT
NEEDLE HYPO 25X1 1.5 SAFETY (NEEDLE) ×1 IMPLANT
NS IRRIG 1000ML POUR BTL (IV SOLUTION) ×1 IMPLANT
PACK BASIN DAY SURGERY FS (CUSTOM PROCEDURE TRAY) ×1 IMPLANT
PENCIL SMOKE EVACUATOR (MISCELLANEOUS) ×1 IMPLANT
PIN SAFETY STERILE (MISCELLANEOUS) ×1 IMPLANT
SHEET MEDIUM DRAPE 40X70 STRL (DRAPES) ×1 IMPLANT
SLEEVE SCD COMPRESS KNEE MED (STOCKING) ×1 IMPLANT
SPONGE T-LAP 18X18 ~~LOC~~+RFID (SPONGE) ×3 IMPLANT
STAPLER SKIN PROX WIDE 3.9 (STAPLE) ×1 IMPLANT
SUT ETHILON 2 0 FS 18 (SUTURE) IMPLANT
SUT MNCRL AB 4-0 PS2 18 (SUTURE) IMPLANT
SUT PDS AB 2-0 CT2 27 (SUTURE) IMPLANT
SUT STRATA 3-0 60 PS-1 (SUTURE) IMPLANT
SUT VIC AB 3-0 PS1 18XBRD (SUTURE) IMPLANT
SUT VIC AB 4-0 PS2 18 (SUTURE) IMPLANT
SYR BULB IRRIG 60ML STRL (SYRINGE) ×1 IMPLANT
SYR CONTROL 10ML LL (SYRINGE) ×1 IMPLANT
TOWEL GREEN STERILE FF (TOWEL DISPOSABLE) ×2 IMPLANT
TUBE CONNECTING 20X1/4 (TUBING) ×1 IMPLANT
UNDERPAD 30X36 HEAVY ABSORB (UNDERPADS AND DIAPERS) ×2 IMPLANT
YANKAUER SUCT BULB TIP NO VENT (SUCTIONS) ×1 IMPLANT

## 2023-11-22 NOTE — Interval H&P Note (Signed)
 History and Physical Interval Note:  11/22/2023 9:47 AM  Carmen Rhodes  has presented today for surgery, with the diagnosis of macromastia chronic neck and back pain.  The various methods of treatment have been discussed with the patient and family. After consideration of risks, benefits and other options for treatment, the patient has consented to  Procedure(s): MAMMOPLASTY, REDUCTION (Bilateral) as a surgical intervention.  The patient's history has been reviewed, patient examined, no change in status, stable for surgery.  I have reviewed the patient's chart and labs.  Questions were answered to the patient's satisfaction.     Carmen Rhodes

## 2023-11-22 NOTE — Anesthesia Preprocedure Evaluation (Addendum)
 Anesthesia Evaluation  Patient identified by MRN, date of birth, ID band Patient awake    Reviewed: Allergy & Precautions, NPO status , Patient's Chart, lab work & pertinent test results  Airway Mallampati: III  TM Distance: >3 FB Neck ROM: Full    Dental no notable dental hx. (+) Dental Advisory Given, Teeth Intact   Pulmonary sleep apnea    Pulmonary exam normal breath sounds clear to auscultation       Cardiovascular hypertension, Pt. on medications Normal cardiovascular exam Rhythm:Regular Rate:Normal     Neuro/Psych  Headaches PSYCHIATRIC DISORDERS Anxiety Depression       GI/Hepatic Neg liver ROS,GERD  Controlled,,  Endo/Other    Class 3 obesity  Renal/GU negative Renal ROS     Musculoskeletal  (+) Arthritis ,    Abdominal  (+) + obese  Peds  Hematology negative hematology ROS (+)   Anesthesia Other Findings   Reproductive/Obstetrics negative OB ROS                             Anesthesia Physical Anesthesia Plan  ASA: 3  Anesthesia Plan: General   Post-op Pain Management: Tylenol  PO (pre-op)* and Gabapentin PO (pre-op)*   Induction: Intravenous  PONV Risk Score and Plan: 4 or greater and Ondansetron , Dexamethasone , Treatment may vary due to age or medical condition and Midazolam  Airway Management Planned: Oral ETT  Additional Equipment:   Intra-op Plan:   Post-operative Plan: Extubation in OR  Informed Consent: I have reviewed the patients History and Physical, chart, labs and discussed the procedure including the risks, benefits and alternatives for the proposed anesthesia with the patient or authorized representative who has indicated his/her understanding and acceptance.     Dental advisory given  Plan Discussed with: CRNA  Anesthesia Plan Comments:         Anesthesia Quick Evaluation

## 2023-11-22 NOTE — Anesthesia Postprocedure Evaluation (Signed)
 Anesthesia Post Note  Patient: Carmen Rhodes  Procedure(s) Performed: MAMMOPLASTY, REDUCTION (Bilateral: Breast)     Patient location during evaluation: PACU Anesthesia Type: General Level of consciousness: sedated and patient cooperative Pain management: pain level controlled Vital Signs Assessment: post-procedure vital signs reviewed and stable Respiratory status: spontaneous breathing Cardiovascular status: stable Anesthetic complications: no  No notable events documented.  Last Vitals:  Vitals:   11/22/23 2200 11/22/23 2230  BP: 123/74   Pulse: 84 75  Resp: 16   Temp: 36.9 C   SpO2: 95% 95%    Last Pain:  Vitals:   11/22/23 2200  TempSrc:   PainSc: 7                  Gorman Laughter

## 2023-11-22 NOTE — Anesthesia Procedure Notes (Signed)
 Procedure Name: Intubation Date/Time: 11/22/2023 10:35 AM  Performed by: Eugenia Hess, CRNAPre-anesthesia Checklist: Patient identified, Emergency Drugs available, Suction available and Patient being monitored Patient Re-evaluated:Patient Re-evaluated prior to induction Oxygen Delivery Method: Circle system utilized Preoxygenation: Pre-oxygenation with 100% oxygen Induction Type: IV induction Ventilation: Mask ventilation without difficulty Laryngoscope Size: Mac and 3 Grade View: Grade II Tube type: Oral Number of attempts: 1 Airway Equipment and Method: Stylet and Oral airway Placement Confirmation: ETT inserted through vocal cords under direct vision, positive ETCO2 and breath sounds checked- equal and bilateral Secured at: 22 cm Tube secured with: Tape Dental Injury: Teeth and Oropharynx as per pre-operative assessment

## 2023-11-22 NOTE — Op Note (Signed)
 Operative Note   DATE OF OPERATION: 6.3.2025  LOCATION: [facility]-outpatient  SURGICAL DIVISION: Plastic Surgery  PREOPERATIVE DIAGNOSES:  1. MAcromastia 2. Chronic neck and back pain 3. Intertrigo  POSTOPERATIVE DIAGNOSES:  same  PROCEDURE:  Bilateral breast reduction  SURGEON: Alger Infield MD MBA  ASSISTANT: none  ANESTHESIA:  General.   EBL: 50 ml  COMPLICATIONS: None immediate.   INDICATIONS FOR PROCEDURE:  The patient, Carmen Rhodes, is a 53 y.o. female born on 04/01/1971, is here for treatment chronic neck and back pain intertrigo in setting of macromastia that has failed conservative measures.   FINDINGS: Right reduction 1017 g Left reduction 1170   DESCRIPTION OF PROCEDURE:  The patient was marked standing in the preoperative area to mark sternal notch, chest midline, anterior axillary lines, inframammary folds. The location of new nipple areolar complex was marked at level of on inframammary fold on anterior surface breast by palpation. This was marked symmetric over bilateral breasts. With aid of Wise pattern marker, location of new nipple areolar complex and vertical limbs (8 cm) were marked by displacement of breasts along meridian. The patient was taken to the operating room. SCDs were placed and IV antibiotics were given. The patient's operative site was prepped and draped in a sterile fashion. A time out was performed and all information was confirmed to be correct.     I began on left breast. Over left breast, superior medial pedicle marked and nipple areolar complex incised with 45 mm diameter marker. Pedicle deepithlialized and developed to chest wall. Pedicle developed to chest wall. Breast tissue resected over lower pole. Medial and lateral flaps developed. Additional superior and lateral breast tissue excised. Breast tailor tacked closed.   I then directed attention to right breast where superior medial pedicle designed. NAC incised with 45 mm  diameter marker. The pedicle was deepithelialized. Pedicle developed to chest wall. Breast tissue resected over lower pole. Medial and lateral flaps developed. Additional superior and lateral breast tissue excised. Breast tailor tacked closed. Patient assessed for symmetry. Breast cavities irrigated and hemostasis obtained. Local anesthetic infiltrated throughout each breast. 19 Fr JP placed in each breast and secured with 2-0 nylon. Closure completed bilateral with interrupted 2-0 PDS at T junction. Interrupted and short running 3-0 vicryl used to approximate dermis along remainder inframammary fold and vertical limb. NAC inset with 3-0 vicryl in dermis. Skin closure completed with 4-0 monocryl subcuticular throughout vertical limbs and NAC. Skin closure completed with 3-0 Strattafix along each IMF. Tissue adhesive applied. Dry dressing and breast binder applied.  The patient was allowed to wake from anesthesia, extubated and taken to the recovery room in satisfactory condition.   SPECIMENS: right and left breast reduction  DRAINS: 19 Fr JP in right and left breast  Alger Infield, MD [facility] Plastic & Reconstructive Surgery  Office/ physician access line after hours [phone]

## 2023-11-22 NOTE — Discharge Instructions (Addendum)
  Post Anesthesia Home Care Instructions  Activity: Get plenty of rest for the remainder of the day. A responsible individual must stay with you for 24 hours following the procedure.  For the next 24 hours, DO NOT: -Drive a car -Advertising copywriter -Drink alcoholic beverages -Take any medication unless instructed by your physician -Make any legal decisions or sign important papers.  Meals: Start with liquid foods such as gelatin or soup. Progress to regular foods as tolerated. Avoid greasy, spicy, heavy foods. If nausea and/or vomiting occur, drink only clear liquids until the nausea and/or vomiting subsides. Call your physician if vomiting continues.  Special Instructions/Symptoms: Your throat may feel dry or sore from the anesthesia or the breathing tube placed in your throat during surgery. If this causes discomfort, gargle with warm salt water. The discomfort should disappear within 24 hours.  If you had a scopolamine patch placed behind your ear for the management of post- operative nausea and/or vomiting:  1. The medication in the patch is effective for 72 hours, after which it should be removed.  Wrap patch in a tissue and discard in the trash. Wash hands thoroughly with soap and water. 2. You may remove the patch earlier than 72 hours if you experience unpleasant side effects which may include dry mouth, dizziness or visual disturbances. 3. Avoid touching the patch. Wash your hands with soap and water after contact with the patch.    Last Tylenol  given at 4:35pm today.

## 2023-11-22 NOTE — Transfer of Care (Signed)
 Immediate Anesthesia Transfer of Care Note  Patient: Carmen Rhodes  Procedure(s) Performed: MAMMOPLASTY, REDUCTION (Bilateral: Breast)  Patient Location: PACU  Anesthesia Type:General  Level of Consciousness: awake, drowsy, and patient cooperative  Airway & Oxygen Therapy: Patient Spontanous Breathing and Patient connected to face mask oxygen  Post-op Assessment: Report given to RN and Post -op Vital signs reviewed and stable  Post vital signs: Reviewed and stable  Last Vitals:  Vitals Value Taken Time  BP    Temp    Pulse 97 11/22/23 1441  Resp 19 11/22/23 1441  SpO2 97 % 11/22/23 1441  Vitals shown include unfiled device data.  Last Pain:  Vitals:   11/22/23 0948  TempSrc: Temporal  PainSc: 0-No pain      Patients Stated Pain Goal: 3 (11/22/23 0948)  Complications: No notable events documented.

## 2023-11-23 ENCOUNTER — Encounter (HOSPITAL_BASED_OUTPATIENT_CLINIC_OR_DEPARTMENT_OTHER): Payer: Self-pay | Admitting: Plastic Surgery

## 2023-11-23 DIAGNOSIS — N62 Hypertrophy of breast: Secondary | ICD-10-CM | POA: Diagnosis not present

## 2023-11-23 LAB — SURGICAL PATHOLOGY

## 2023-11-23 NOTE — Discharge Summary (Signed)
 Physician Discharge Summary  Patient ID: Carmen Rhodes MRN: [id number] DOB/AGE: [birth year] 53 y.o.  Admit date: 11/22/2023 Discharge date: 11/23/2023  Admission Diagnoses: Macromastia  Discharge Diagnoses:  Principal Problem:   Macromastia   Discharged Condition: stable  Hospital Course: Post operatively patient required oxygen in PACU and complained of pain. Patient was placed in observation. Overnight patient did not require oxygen and pain control improved. Patient was ambulatory without assist and tolerated diet. Drain teaching and bathing instruction completed.  Treatments: surgery: bilateral breast reduction 6.3.2025  Discharge Exam: Blood pressure 116/76, pulse 77, temperature 98 F (36.7 C), resp. rate 16, height 5\' 5"  (1.651 m), weight 112.3 kg, SpO2 100%. Incision/Wound: incisions dry intact drains serosanguinous breasts soft NACs viable  Disposition: Discharge disposition: 01-Home or Self Care       Discharge Instructions     Call MD for:  redness, tenderness, or signs of infection (pain, swelling, bleeding, redness, odor or green/yellow discharge around incision site)   Complete by: As directed    Call MD for:  temperature >100.5   Complete by: As directed    Discharge instructions   Complete by: As directed    Ok to remove dressings and shower am 6.5.2025. Soap and water ok, pat incisions dry. No creams or ointments over incisions. Do not let drains dangle in shower, attach to lanyard or similar.Strip and record drains twice daily and bring log to clinic visit.  Breast binder or soft compression bra all other times.  Ok to raise arms above shoulders for bathing and dressing.  No house yard work or exercise until cleared by MD.  Patient received all Rx preop. Recommend Miralax or Dulcolax as needed for constipation. Also ok to use Tylenol  for pain. Ice packs to chest ok for comfort.   Driving Restrictions   Complete by: As directed    No driving if taking  prescription pain medication   Lifting restrictions   Complete by: As directed    No lifting > 5-10 lbs until cleared by MD   Resume previous diet   Complete by: As directed       Allergies as of 11/23/2023       Reactions   Ace Inhibitors    Latex    Nsaids    Gastric sleeve         Medication List     TAKE these medications    atorvastatin 20 MG tablet Commonly known as: LIPITOR Take 20 mg by mouth daily.   cetirizine 10 MG tablet Commonly known as: ZYRTEC Take 10 mg by mouth daily.   cyanocobalamin 1000 MCG tablet Commonly known as: VITAMIN B12 Take 1,000 mcg by mouth daily.   lidocaine  5 % Commonly known as: Lidoderm  Place 1 patch onto the skin daily. Remove & Discard patch within 12 hours or as directed by MD   lisinopril 10 MG tablet Commonly known as: ZESTRIL Take 10 mg by mouth daily.   sertraline 100 MG tablet Commonly known as: ZOLOFT Take 100 mg by mouth daily.   sertraline 100 MG tablet Commonly known as: ZOLOFT Take 200 mg by mouth daily.   valACYclovir 500 MG tablet Commonly known as: VALTREX Take 500 mg by mouth 2 (two) times daily.   Vitamin D-3 125 MCG (5000 UT) Tabs Take 125 mcg by mouth daily.   ZEPBOUND Raymond Inject into the skin.        Follow-up Information     Alger Infield, MD Follow up on 11/28/2023.  Specialty: Plastic Surgery Why: as scheduled Contact information: [address] [place] [address] [phone]                 Signed: Alger Infield 11/23/2023, 7:52 AM

## 2023-11-27 NOTE — Addendum Note (Signed)
 Addendum  created 11/27/23 1354 by Glorya Larsson, CRNA   Intraprocedure Meds edited

## 2024-06-17 ENCOUNTER — Emergency Department (HOSPITAL_BASED_OUTPATIENT_CLINIC_OR_DEPARTMENT_OTHER): Payer: MEDICAID

## 2024-06-17 ENCOUNTER — Emergency Department (HOSPITAL_BASED_OUTPATIENT_CLINIC_OR_DEPARTMENT_OTHER)
Admission: EM | Admit: 2024-06-17 | Discharge: 2024-06-18 | Disposition: A | Payer: MEDICAID | Attending: Emergency Medicine | Admitting: Emergency Medicine

## 2024-06-17 ENCOUNTER — Other Ambulatory Visit: Payer: Self-pay

## 2024-06-17 ENCOUNTER — Encounter (HOSPITAL_BASED_OUTPATIENT_CLINIC_OR_DEPARTMENT_OTHER): Payer: Self-pay

## 2024-06-17 DIAGNOSIS — Z79899 Other long term (current) drug therapy: Secondary | ICD-10-CM | POA: Insufficient documentation

## 2024-06-17 DIAGNOSIS — Z91040 Latex allergy status: Secondary | ICD-10-CM | POA: Diagnosis not present

## 2024-06-17 DIAGNOSIS — S2242XA Multiple fractures of ribs, left side, initial encounter for closed fracture: Secondary | ICD-10-CM | POA: Diagnosis not present

## 2024-06-17 DIAGNOSIS — W06XXXA Fall from bed, initial encounter: Secondary | ICD-10-CM | POA: Insufficient documentation

## 2024-06-17 DIAGNOSIS — S29001A Unspecified injury of muscle and tendon of front wall of thorax, initial encounter: Secondary | ICD-10-CM | POA: Diagnosis present

## 2024-06-17 DIAGNOSIS — I10 Essential (primary) hypertension: Secondary | ICD-10-CM | POA: Insufficient documentation

## 2024-06-17 MED ORDER — OXYCODONE HCL 5 MG PO TABS: 5.0000 mg | ORAL_TABLET | Freq: Four times a day (QID) | ORAL | 0 refills | Status: AC | PRN

## 2024-06-17 MED ORDER — OXYCODONE HCL 5 MG PO TABS
5.0000 mg | ORAL_TABLET | Freq: Once | ORAL | Status: AC
  Administered 2024-06-17: 5 mg via ORAL
  Filled 2024-06-17: qty 1

## 2024-06-17 MED ORDER — ACETAMINOPHEN 500 MG PO TABS
1000.0000 mg | ORAL_TABLET | Freq: Once | ORAL | Status: AC
  Administered 2024-06-17: 1000 mg via ORAL
  Filled 2024-06-17: qty 2

## 2024-06-17 NOTE — Progress Notes (Signed)
 IS given, with education.

## 2024-06-17 NOTE — ED Provider Notes (Signed)
 "  EMERGENCY DEPARTMENT AT [facility] Provider Note   CSN: [id number] Arrival date & time: 06/17/24  1615     Patient presents with: Chest Pain (rib)   Carmen Rhodes is a 53 y.o. female.   53 year old female presents with her husband.  She states on Christmas Day they were at a hotel and he fell on top of her.  She fell on top of the bed and he fell on top of her.  Pain has persisted since then.  She has been taking Tylenol  twice a day with some relief.  She is unable to take NSAIDs.  Pain with taking a deep breath otherwise no additional concerns.  Pain on the left side of the chest wall.  The history is provided by the patient. No language interpreter was used.       Prior to Admission medications  Medication Sig Start Date End Date Taking? Authorizing Provider  atorvastatin  (LIPITOR) 20 MG tablet Take 20 mg by mouth daily.    [provider]  cetirizine (ZYRTEC) 10 MG tablet Take 10 mg by mouth daily.    [provider]  Cholecalciferol (VITAMIN D-3) 125 MCG (5000 UT) TABS Take 125 mcg by mouth daily.    [provider]  cyanocobalamin (VITAMIN B12) 1000 MCG tablet Take 1,000 mcg by mouth daily.    [provider]  lidocaine  (LIDODERM ) 5 % Place 1 patch onto the skin daily. Remove & Discard patch within 12 hours or as directed by MD 12/14/18   Haze Lonni PARAS, MD  lisinopril  (PRINIVIL ,ZESTRIL ) 10 MG tablet Take 10 mg by mouth daily.    [provider]  sertraline  (ZOLOFT ) 100 MG tablet Take 200 mg by mouth daily.    [provider]  sertraline  (ZOLOFT ) 100 MG tablet Take 100 mg by mouth daily.    [provider]  Tirzepatide-Weight Management (ZEPBOUND Regent) Inject into the skin.    [provider]  valACYclovir (VALTREX) 500 MG tablet Take 500 mg by mouth 2 (two) times daily.    [provider]    Allergies: Ace inhibitors, Latex, and Nsaids    Review of Systems   Constitutional:  Negative for chills and fever.  Respiratory:  Negative for shortness of breath.   Cardiovascular:  Positive for chest pain. Negative for palpitations and leg swelling.  All other systems reviewed and are negative.   Updated Vital Signs BP (!) 135/93 (BP Location: Left Arm)   Pulse (!) 109   Temp 98.6 F (37 C)   Resp 18   SpO2 98%   Physical Exam Vitals and nursing note reviewed.  Constitutional:      General: She is not in acute distress.    Appearance: Normal appearance. She is not ill-appearing.  HENT:     Head: Normocephalic and atraumatic.     Nose: Nose normal.  Eyes:     Conjunctiva/sclera: Conjunctivae normal.  Pulmonary:     Effort: Pulmonary effort is normal. No respiratory distress.  Abdominal:     General: There is no distension.     Tenderness: There is no abdominal tenderness. There is no guarding.  Musculoskeletal:        General: No deformity.     Comments: Left lower chest wall tenderness to palpation.  Skin:    Findings: No rash.  Neurological:     Mental Status: She is alert.     (all labs ordered are listed, but only abnormal results are  displayed) Labs Reviewed - No data to display  EKG: None  Radiology: DG Ribs Unilateral W/Chest Left Result Date: 06/17/2024 EXAM: 1 VIEW XRAY OF THE LEFT RIBS AND CHEST 06/17/2024 05:39:00 PM COMPARISON: Chest x-ray 02/19/2019. CLINICAL HISTORY: rib pain FINDINGS: BONES: Cortical irregularity consistent with acute minimally displaced rib fractures of the lateral left 9th and 10th ribs. LUNGS AND PLEURA: No consolidation or pulmonary edema. No pleural effusion or pneumothorax. HEART AND MEDIASTINUM: No acute abnormality of the cardiac and mediastinal silhouettes. CHEST WALL AND UPPER ABDOMEN: Right upper quadrant surgical clips noted. Bb marker overlies the lower left chest wall. IMPRESSION: 1. Acute minimally displaced fractures of the lateral left 9th and 10th ribs. No associated pneumothorax.  2. No acute cardiopulmonary abnormality. Electronically signed by: Morgane Naveau MD 06/17/2024 06:37 PM EST RP Workstation: [id number]     Procedures   Medications Ordered in the ED  acetaminophen  (TYLENOL ) tablet 1,000 mg (has no administration in time range)  oxyCODONE  (Oxy IR/ROXICODONE ) immediate release tablet 5 mg (has no administration in time range)                                    Medical Decision Making Amount and/or Complexity of Data Reviewed Radiology: ordered.  Risk OTC drugs. Prescription drug management.   Medical Decision Making / ED Course   This patient presents to the ED for concern of chest wall pain, this involves an extensive number of treatment options, and is a complaint that carries with it a high risk of complications and morbidity.  The differential diagnosis includes fracture, contusion, pulmonary contusion  MDM: 53 year old female presents today for concern of left-sided chest wall pain.  This occurred on Christmas Day morning.  Her husband fell on top of her.  No other injury. No shortness of breath.  Not on any blood thinning medicine.  Chest x-ray shows 2 rib fractures on the left side. Abdominal exam is benign.  Will prescribe pain medication.  Discussed increasing Tylenol  to 1000 mg 4 times a day.  Incentive spirometer given.  Discharged in stable condition.  Return precaution discussed.  Given abdominal exam is benign.  Injury is 59 days old do not feel we need CT imaging specially with only 2 rib fractures.   Additional history obtained: -Additional history obtained from husband at bedside -External records from outside source obtained and reviewed including: Chart review including previous notes, labs, imaging, consultation notes   Lab Tests: -I ordered, reviewed, and interpreted labs.   The pertinent results include:   Labs Reviewed - No data to display    EKG  EKG Interpretation Date/Time:    Ventricular Rate:    PR  Interval:    QRS Duration:    QT Interval:    QTC Calculation:   R Axis:      Text Interpretation:           Imaging Studies ordered: I ordered imaging studies including chest x-ray I independently visualized and interpreted imaging. I agree with the radiologist interpretation   Medicines ordered and prescription drug management: Meds ordered this encounter  Medications   acetaminophen  (TYLENOL ) tablet 1,000 mg   oxyCODONE  (Oxy IR/ROXICODONE ) immediate release tablet 5 mg    Refill:  0    -I have reviewed the patients home medicines and have made adjustments as needed  Reevaluation: After the interventions noted above, I reevaluated the patient and found that they have :  stayed the same  Co morbidities that complicate the patient evaluation  Past Medical History:  Diagnosis Date   Complication of anesthesia    hard to wake up after anesthesia in 2001; has had no issues since   Diverticulitis    Fatty liver    Patient states she no longer has   Genital herpes    GERD (gastroesophageal reflux disease)    HTN (hypertension)    MDD (major depressive disorder)    OSA (obstructive sleep apnea)    patient states she no longer has OSA   Pancreatitis    PTSD (post-traumatic stress disorder)       Dispostion: Discharged in stable condition.  Return precaution discussed.  Discussed importance of incentive spirometer.  Discussed reevaluation with PCP.  Discussed strict return precautions.  Final diagnoses:  Closed fracture of multiple ribs of left side, initial encounter    ED Discharge Orders          Ordered    oxyCODONE  (ROXICODONE ) 5 MG immediate release tablet  Every 6 hours PRN        06/17/24 1949               Hildegard Loge, PA-C 06/17/24 1950    Patt Alm Macho, MD 06/17/24 2342  "

## 2024-06-17 NOTE — Discharge Instructions (Signed)
 You have 2 rib fractures on the left side involving the 9th and 10th rib.  Follow-up with your primary care doctor in 1 week for reevaluation.  In the meantime use the incentive spirometer tool to continue taking deep breaths.  Not doing so can lead to pneumonia.  Continue taking Tylenol  1000 mg 4 times a day.  I have sent pain medication into the pharmacy for you.  Take this for severe breakthrough pain.  Return for any worsening symptoms.

## 2024-06-17 NOTE — ED Triage Notes (Signed)
 Reports husband fell ontop of pt chest. Reports pain in L back ribs. States feels [facility]
